# Patient Record
Sex: Female | Born: 1960 | Race: White | Hispanic: No | Smoking: Current every day smoker
Health system: Southern US, Community
[De-identification: ages and names within clinical notes are randomized; demographics above are authoritative.]

## PROBLEM LIST (undated history)

## (undated) DIAGNOSIS — I219 Acute myocardial infarction, unspecified: Secondary | ICD-10-CM

## (undated) DIAGNOSIS — J189 Pneumonia, unspecified organism: Secondary | ICD-10-CM

## (undated) DIAGNOSIS — J449 Chronic obstructive pulmonary disease, unspecified: Secondary | ICD-10-CM

## (undated) DIAGNOSIS — C259 Malignant neoplasm of pancreas, unspecified: Secondary | ICD-10-CM

## (undated) DIAGNOSIS — F32A Depression, unspecified: Secondary | ICD-10-CM

## (undated) DIAGNOSIS — R1900 Intra-abdominal and pelvic swelling, mass and lump, unspecified site: Secondary | ICD-10-CM

## (undated) DIAGNOSIS — F419 Anxiety disorder, unspecified: Secondary | ICD-10-CM

## (undated) DIAGNOSIS — C229 Malignant neoplasm of liver, not specified as primary or secondary: Secondary | ICD-10-CM

## (undated) DIAGNOSIS — E785 Hyperlipidemia, unspecified: Secondary | ICD-10-CM

## (undated) DIAGNOSIS — F329 Major depressive disorder, single episode, unspecified: Secondary | ICD-10-CM

## (undated) DIAGNOSIS — G40909 Epilepsy, unspecified, not intractable, without status epilepticus: Secondary | ICD-10-CM

## (undated) DIAGNOSIS — I10 Essential (primary) hypertension: Secondary | ICD-10-CM

## (undated) HISTORY — PX: TONSILLECTOMY: SUR1361

---

## 2016-09-03 ENCOUNTER — Other Ambulatory Visit: Payer: Self-pay | Admitting: Family Medicine

## 2016-09-03 DIAGNOSIS — Z1231 Encounter for screening mammogram for malignant neoplasm of breast: Secondary | ICD-10-CM

## 2016-09-26 ENCOUNTER — Ambulatory Visit
Admission: RE | Admit: 2016-09-26 | Discharge: 2016-09-26 | Disposition: A | Discharge status: Home / Self Care | Payer: Medicare Other | Source: Ambulatory Visit | Attending: Family Medicine | Admitting: Family Medicine

## 2016-09-26 ENCOUNTER — Encounter: Payer: Self-pay | Admitting: Radiology

## 2016-09-26 DIAGNOSIS — Z1231 Encounter for screening mammogram for malignant neoplasm of breast: Secondary | ICD-10-CM | POA: Insufficient documentation

## 2016-10-17 DIAGNOSIS — Z79899 Other long term (current) drug therapy: Secondary | ICD-10-CM | POA: Diagnosis not present

## 2016-10-17 DIAGNOSIS — R1011 Right upper quadrant pain: Secondary | ICD-10-CM | POA: Diagnosis not present

## 2016-10-17 DIAGNOSIS — R197 Diarrhea, unspecified: Secondary | ICD-10-CM | POA: Insufficient documentation

## 2016-10-17 DIAGNOSIS — R1013 Epigastric pain: Secondary | ICD-10-CM | POA: Diagnosis present

## 2016-10-17 DIAGNOSIS — R1110 Vomiting, unspecified: Secondary | ICD-10-CM | POA: Diagnosis not present

## 2016-10-17 LAB — CBC
HCT: 47.4 % — ABNORMAL HIGH (ref 35.0–47.0)
HEMOGLOBIN: 16.2 g/dL — AB (ref 12.0–16.0)
MCH: 32.6 pg (ref 26.0–34.0)
MCHC: 34.3 g/dL (ref 32.0–36.0)
MCV: 95.3 fL (ref 80.0–100.0)
PLATELETS: 353 10*3/uL (ref 150–440)
RBC: 4.97 MIL/uL (ref 3.80–5.20)
RDW: 12.2 % (ref 11.5–14.5)
WBC: 8.3 10*3/uL (ref 3.6–11.0)

## 2016-10-17 LAB — COMPREHENSIVE METABOLIC PANEL
ALBUMIN: 4.7 g/dL (ref 3.5–5.0)
ALK PHOS: 59 U/L (ref 38–126)
ALT: 31 U/L (ref 14–54)
ANION GAP: 15 (ref 5–15)
AST: 38 U/L (ref 15–41)
BUN: 6 mg/dL (ref 6–20)
CALCIUM: 9.8 mg/dL (ref 8.9–10.3)
CHLORIDE: 97 mmol/L — AB (ref 101–111)
CO2: 19 mmol/L — AB (ref 22–32)
Creatinine, Ser: 0.6 mg/dL (ref 0.44–1.00)
GFR calc Af Amer: >60 mL/min (ref >60)
GFR calc non Af Amer: >60 mL/min (ref >60)
GLUCOSE: 140 mg/dL — AB (ref 65–99)
Potassium: 3.6 mmol/L (ref 3.5–5.1)
SODIUM: 131 mmol/L — AB (ref 135–145)
Total Bilirubin: 1 mg/dL (ref 0.3–1.2)
Total Protein: 8.3 g/dL — ABNORMAL HIGH (ref 6.5–8.1)

## 2016-10-17 LAB — TROPONIN I

## 2016-10-17 LAB — LIPASE, BLOOD: Lipase: 47 U/L (ref 11–51)

## 2016-10-17 MED ORDER — ONDANSETRON 4 MG PO TBDP
4.0000 mg | ORAL_TABLET | Freq: Once | ORAL | Status: AC
  Administered 2016-10-17: 4 mg via ORAL

## 2016-10-17 MED ORDER — ONDANSETRON 4 MG PO TBDP
ORAL_TABLET | ORAL | Status: AC
  Filled 2016-10-17: qty 1

## 2016-10-17 NOTE — ED Triage Notes (Signed)
Pt in with co epigastric pain x 2 days and vomiting today. Has had some diarrhea but no dysuria.

## 2016-10-18 ENCOUNTER — Emergency Department: Payer: Medicare Other

## 2016-10-18 ENCOUNTER — Emergency Department
Admission: EM | Admit: 2016-10-18 | Discharge: 2016-10-18 | Disposition: A | Discharge status: Home / Self Care | Payer: Medicare Other | Attending: Emergency Medicine | Admitting: Emergency Medicine

## 2016-10-18 DIAGNOSIS — R1011 Right upper quadrant pain: Secondary | ICD-10-CM

## 2016-10-18 DIAGNOSIS — R1110 Vomiting, unspecified: Secondary | ICD-10-CM

## 2016-10-18 LAB — URINALYSIS COMPLETE WITH MICROSCOPIC (ARMC ONLY)
Bilirubin Urine: NEGATIVE
Glucose, UA: 150 mg/dL — AB
NITRITE: NEGATIVE
PH: 5 (ref 5.0–8.0)
PROTEIN: 100 mg/dL — AB
SPECIFIC GRAVITY, URINE: 1.018 (ref 1.005–1.030)

## 2016-10-18 MED ORDER — ONDANSETRON HCL 4 MG/2ML IJ SOLN
INTRAMUSCULAR | Status: AC
  Administered 2016-10-18: 4 mg via INTRAVENOUS
  Filled 2016-10-18: qty 2

## 2016-10-18 MED ORDER — ONDANSETRON 4 MG PO TBDP: 4.0000 mg | ORAL_TABLET | Freq: Three times a day (TID) | ORAL | 0 refills | Status: AC | PRN

## 2016-10-18 MED ORDER — ACETAMINOPHEN 325 MG PO TABS
ORAL_TABLET | ORAL | Status: AC
  Administered 2016-10-18: 650 mg via ORAL
  Filled 2016-10-18: qty 2

## 2016-10-18 MED ORDER — ACETAMINOPHEN 325 MG PO TABS
650.0000 mg | ORAL_TABLET | Freq: Once | ORAL | Status: AC
  Administered 2016-10-18: 650 mg via ORAL

## 2016-10-18 MED ORDER — SODIUM CHLORIDE 0.9 % IV BOLUS (SEPSIS)
1000.0000 mL | Freq: Once | INTRAVENOUS | Status: AC
  Administered 2016-10-18: 1000 mL via INTRAVENOUS

## 2016-10-18 MED ORDER — ONDANSETRON HCL 4 MG/2ML IJ SOLN
4.0000 mg | Freq: Once | INTRAMUSCULAR | Status: AC
  Administered 2016-10-18: 4 mg via INTRAVENOUS

## 2016-10-18 NOTE — ED Notes (Signed)
Patient transported to Ultrasound 

## 2016-10-18 NOTE — ED Provider Notes (Signed)
[facility] Emergency Department Provider Note    First MD Initiated Contact with Patient 10/18/16 0116     (approximate)  I have reviewed the triage vital signs and the nursing notes.   HISTORY  Chief Complaint Abdominal Pain    HPI Chelsea Clements is a 55 y.o. female presents with 2 day history of epigastric abdominal pain and vomiting. Patient also admits to 2 episodes of diarrhea. Patient denies any fever. Patient denies any urinary symptoms. Patient states symptoms onset occurred after eating [place] food.   Past medical history None There are no active problems to display for this patient.   Past surgical history None  Prior to Admission medications   Not on File    Allergies Latex  No family history on file.  Social History Social History  Substance Use Topics  . Smoking status: Not on file  . Smokeless tobacco: Not on file  . Alcohol use Not on file    Review of Systems Constitutional: No fever/chills Eyes: No visual changes. ENT: No sore throat. Cardiovascular: Denies chest pain. Respiratory: Denies shortness of breath. Gastrointestinal: Positive for abdominal pain and vomiting and diarrhea Genitourinary: Negative for dysuria. Musculoskeletal: Negative for back pain. Skin: Negative for rash. Neurological: Negative for headaches, focal weakness or numbness.  10-point ROS otherwise negative.  ____________________________________________   PHYSICAL EXAM:  VITAL SIGNS: ED Triage Vitals  Enc Vitals Group     BP 10/17/16 2112 (!) 155/90     Pulse Rate 10/17/16 2112 86     Resp 10/17/16 2112 18     Temp 10/17/16 2112 98.4 F (36.9 C)     Temp Source 10/17/16 2112 Oral     SpO2 10/17/16 2112 100 %     Weight 10/17/16 2113 145 lb (65.8 kg)     Height 10/17/16 2113 5\' 4"  (1.626 m)     Head Circumference --      Peak Flow --      Pain Score 10/17/16 2113 6     Pain Loc --      Pain Edu? --      Excl. in [place]? --       Constitutional: Alert and oriented. Well appearing and in no acute distress. Eyes: Conjunctivae are normal. PERRL. EOMI. Head: Atraumatic. Ears:  Healthy appearing ear canals and TMs bilaterally Nose: No congestion/rhinnorhea. Mouth/Throat: Mucous membranes are moist.  Oropharynx non-erythematous. Neck: No stridor.  No meningeal signs.  No cervical spine tenderness to palpation. Cardiovascular: Normal rate, regular rhythm. Good peripheral circulation. Grossly normal heart sounds. Respiratory: Normal respiratory effort.  No retractions. Lungs CTAB. Gastrointestinal: Soft and nontender. No distention.  Musculoskeletal: No lower extremity tenderness nor edema. No gross deformities of extremities. Neurologic:  Normal speech and language. No gross focal neurologic deficits are appreciated.  Skin:  Skin is warm, dry and intact. No rash noted. Psychiatric: Mood and affect are normal. Speech and behavior are normal.  ____________________________________________   LABS (all labs ordered are listed, but only abnormal results are displayed)  Labs Reviewed  CBC - Abnormal; Notable for the following:       Result Value   Hemoglobin 16.2 (*)    HCT 47.4 (*)    All other components within normal limits  COMPREHENSIVE METABOLIC PANEL - Abnormal; Notable for the following:    Sodium 131 (*)    Chloride 97 (*)    CO2 19 (*)    Glucose, Bld 140 (*)    Total Protein 8.3 (*)  All other components within normal limits  URINALYSIS COMPLETEWITH MICROSCOPIC ([facility] ONLY) - Abnormal; Notable for the following:    Color, Urine YELLOW (*)    APPearance HAZY (*)    Glucose, UA 150 (*)    Ketones, ur 1+ (*)    Hgb urine dipstick 1+ (*)    Protein, ur 100 (*)    Leukocytes, UA 1+ (*)    Bacteria, UA RARE (*)    Squamous Epithelial / LPF 6-30 (*)    All other components within normal limits  LIPASE, BLOOD  TROPONIN I   ____________________________________________  EKG  ED ECG REPORT I,  Cutler N BROWN, the attending physician, personally viewed and interpreted this ECG.   Date: 10/18/2016  EKG Time: 9:21 PM  Rate: 116  Rhythm: Sinus tachycardia  Axis: Normal  Intervals: Normal  ST&T Change: None  ____________________________________________  RADIOLOGY I, Santaquin N BROWN, personally viewed and evaluated these images (plain radiographs) as part of my medical decision making, as well as reviewing the written report by the radiologist.  US Abdomen Limited Ruq  Result Date: 10/18/2016 CLINICAL DATA:  Right upper quadrant pain and vomiting for 2 days EXAM: US ABDOMEN LIMITED - RIGHT UPPER QUADRANT COMPARISON:  None. FINDINGS: Gallbladder: No gallstones or wall thickening visualized. No sonographic Murphy sign noted by sonographer. Common bile duct: Diameter: 4.5 mm Liver: Mildly increased echogenicity and mild coarsening of hepatic parenchyma, likely fatty infiltration. No focal liver lesion. IMPRESSION: Hepatic parenchymal echogenicity, suggesting fatty infiltration. Normal gallbladder and bile ducts. Electronically Signed   By: Andreas Newport M.D.   On: 10/18/2016 02:12     Procedures     INITIAL IMPRESSION / ASSESSMENT AND PLAN / ED COURSE  Pertinent labs & imaging results that were available during my care of the patient were reviewed by me and considered in my medical decision making (see chart for details).  History of physical exam consistent with possible gastroenteritis. Laboratory data unremarkable ultrasound also unremarkable. Patient received IV normal saline and Zofran in the emergency department with complete resolution of symptoms.   Clinical Course    ____________________________________________  FINAL CLINICAL IMPRESSION(S) / ED DIAGNOSES  Final diagnoses:  Vomiting  RUQ pain     MEDICATIONS GIVEN DURING THIS VISIT:  Medications  ondansetron (ZOFRAN-ODT) 4 MG disintegrating tablet (not administered)  ondansetron (ZOFRAN-ODT)  disintegrating tablet 4 mg (4 mg Oral Given 10/17/16 2117)  ondansetron (ZOFRAN) injection 4 mg (4 mg Intravenous Given 10/18/16 0132)  sodium chloride 0.9 % bolus 1,000 mL (0 mLs Intravenous Stopped 10/18/16 0329)  acetaminophen (TYLENOL) tablet 650 mg (650 mg Oral Given 10/18/16 0328)     NEW OUTPATIENT MEDICATIONS STARTED DURING THIS VISIT:  New Prescriptions   No medications on file    Modified Medications   No medications on file    Discontinued Medications   No medications on file     Note:  This document was prepared using Dragon voice recognition software and may include unintentional dictation errors.    Gregor Hams, MD 10/18/16 2249

## 2016-10-18 NOTE — ED Notes (Signed)
ED Provider at bedside. 

## 2016-10-18 NOTE — ED Notes (Signed)

## 2016-11-29 ENCOUNTER — Encounter: Payer: Self-pay | Admitting: Emergency Medicine

## 2016-11-29 ENCOUNTER — Emergency Department
Admission: EM | Admit: 2016-11-29 | Discharge: 2016-11-29 | Disposition: A | Discharge status: Home / Self Care | Payer: Medicare Other | Attending: Emergency Medicine | Admitting: Emergency Medicine

## 2016-11-29 ENCOUNTER — Emergency Department: Payer: Medicare Other

## 2016-11-29 DIAGNOSIS — Z79899 Other long term (current) drug therapy: Secondary | ICD-10-CM | POA: Insufficient documentation

## 2016-11-29 DIAGNOSIS — I10 Essential (primary) hypertension: Secondary | ICD-10-CM | POA: Insufficient documentation

## 2016-11-29 DIAGNOSIS — Y999 Unspecified external cause status: Secondary | ICD-10-CM | POA: Diagnosis not present

## 2016-11-29 DIAGNOSIS — Y939 Activity, unspecified: Secondary | ICD-10-CM | POA: Insufficient documentation

## 2016-11-29 DIAGNOSIS — W01198A Fall on same level from slipping, tripping and stumbling with subsequent striking against other object, initial encounter: Secondary | ICD-10-CM | POA: Insufficient documentation

## 2016-11-29 DIAGNOSIS — Y929 Unspecified place or not applicable: Secondary | ICD-10-CM | POA: Insufficient documentation

## 2016-11-29 DIAGNOSIS — S0081XA Abrasion of other part of head, initial encounter: Secondary | ICD-10-CM | POA: Diagnosis not present

## 2016-11-29 DIAGNOSIS — F17200 Nicotine dependence, unspecified, uncomplicated: Secondary | ICD-10-CM | POA: Diagnosis not present

## 2016-11-29 DIAGNOSIS — S93492A Sprain of other ligament of left ankle, initial encounter: Secondary | ICD-10-CM | POA: Diagnosis not present

## 2016-11-29 DIAGNOSIS — S99912A Unspecified injury of left ankle, initial encounter: Secondary | ICD-10-CM | POA: Diagnosis present

## 2016-11-29 HISTORY — DX: Epilepsy, unspecified, not intractable, without status epilepticus: G40.909

## 2016-11-29 HISTORY — DX: Essential (primary) hypertension: I10

## 2016-11-29 MED ORDER — MELOXICAM 7.5 MG PO TABS: 7.5000 mg | ORAL_TABLET | Freq: Every day | ORAL | 2 refills | Status: DC

## 2016-11-29 MED ORDER — KETOROLAC TROMETHAMINE 60 MG/2ML IM SOLN
60.0000 mg | Freq: Once | INTRAMUSCULAR | Status: AC
  Administered 2016-11-29: 60 mg via INTRAMUSCULAR
  Filled 2016-11-29: qty 2

## 2016-11-29 NOTE — ED Provider Notes (Signed)
[facility] Emergency Department Provider Note  ____________________________________________   None    (approximate)  I have reviewed the triage vital signs and the nursing notes.   HISTORY  Chief Complaint Head Laceration and Ankle Pain    HPI Fantashia Jochim is a 55 y.o. female presenting after slipping and falling while carrying food. Patient states that she did not lose consciousness but hit the back of her head during the fall. She denies nausea, vomiting and acute blurry vision. Additional complaints include left ankle pain. Patient states that when she fell, she sustained a left ankle inversion injury. Patient has never had an ankle sprain to the left lower extremity. She denies prior traumas or surgeries to the left lower extremity. Pain is currently 6 out of 10 in intensity and described as throbbing. She has taken Tylenol, which has provided limited relief.   Past Medical History:  Diagnosis Date  . Epilepsy ([place])   . Hypertension     There are no active problems to display for this patient.   History reviewed. No pertinent surgical history.  Prior to Admission medications   Medication Sig Start Date End Date Taking? Authorizing Provider  ADVAIR DISKUS 250-50 MCG/DOSE AEPB Take 1 puff by mouth daily.    Historical Provider, MD  amLODipine (NORVASC) 5 MG tablet Take 5 mg by mouth daily.    Historical Provider, MD  escitalopram (LEXAPRO) 20 MG tablet Take 20 mg by mouth daily.    Historical Provider, MD  fenofibrate (TRICOR) 145 MG tablet Take 145 mg by mouth daily.    Historical Provider, MD  lamoTRIgine (LAMICTAL) 100 MG tablet Take 100 mg by mouth 2 (two) times daily.    Historical Provider, MD  lamoTRIgine (LAMICTAL) 25 MG tablet Take 50 mg by mouth every morning.    Historical Provider, MD  meloxicam (MOBIC) 7.5 MG tablet Take 1 tablet (7.5 mg total) by mouth daily. 11/29/16 11/29/17  Lannie Fields, PA-C  ondansetron (ZOFRAN ODT) 4 MG  disintegrating tablet Take 1 tablet (4 mg total) by mouth every 8 (eight) hours as needed for nausea or vomiting. 10/18/16   Gregor Hams, MD  simvastatin (ZOCOR) 20 MG tablet Take 20 mg by mouth at bedtime.    Historical Provider, MD    Allergies Latex  No family history on file.  Social History Social History  Substance Use Topics  . Smoking status: Current Every Day Smoker  . Smokeless tobacco: Never Used  . Alcohol use No    Review of Systems Constitutional: No fever/chills Eyes: No visual changes. Cardiovascular: Denies chest pain. Respiratory: Denies shortness of breath. Gastrointestinal: No abdominal pain.  No nausea, no vomiting.  No diarrhea.  No constipation. Musculoskeletal: Patient has left ankle pain. Skin: Negative for rash. Neurological: Negative for headaches, focal weakness or numbness.  10-point ROS otherwise negative.  ____________________________________________   PHYSICAL EXAM:  VITAL SIGNS: ED Triage Vitals  Enc Vitals Group     BP 11/29/16 1602 136/72     Pulse Rate 11/29/16 1602 92     Resp 11/29/16 1602 16     Temp 11/29/16 1602 99.4 F (37.4 C)     Temp Source 11/29/16 1602 Oral     SpO2 11/29/16 1602 99 %     Weight 11/29/16 1604 140 lb (63.5 kg)     Height 11/29/16 1604 5\' 3"  (1.6 m)     Head Circumference --      Peak Flow --  Pain Score 11/29/16 1604 6     Pain Loc --      Pain Edu? --      Excl. in [place]? --     Constitutional: Alert and oriented. Well appearing and in no acute distress. Eyes: Conjunctivae are normal. PERRL. EOMI. Head: Patient has a 1 cm x 1 cm abrasion on the back of her head.  Cardiovascular: Normal rate, regular rhythm. Grossly normal heart sounds.  Good peripheral circulation. Respiratory: Normal respiratory effort.  No retractions. Lungs CTAB. Gastrointestinal: Soft and nontender. No distention. No abdominal bruits. No CVA tenderness. Musculoskeletal: Patient has 5/5 strength in the upper and lower  extremities bilaterally.  Full range of motion at the hip and knee bilaterally. Patient has limited dorsiflexion and plantar flexion on the left, likely secondary to pain. Patient is tender to palpation along the anterior talofibular ligament, left. Palpable dorsalis pedis pulse bilaterally. Neurologic:  Normal speech and language. No gross focal neurologic deficits are appreciated. No gait instability. Skin:  Skin is warm, dry and intact. Bruising is visualized at the skin overlying the anterior talofibular ligament. Psychiatric: Mood and affect are normal. Speech and behavior are normal.  ____________________________________________   LABS (all labs ordered are listed, but only abnormal results are displayed)  Labs Reviewed - No data to display  RADIOLOGY  DG Ankle: No acute fractures or dislocations    Procedures  Toradol    ____________________________________________   INITIAL IMPRESSION / ASSESSMENT AND PLAN / ED COURSE  Pertinent labs & imaging results that were available during my care of the patient were reviewed by me and considered in my medical decision making (see chart for details).  Assessment and plan: Left ankle sprain: DG ankle conducted in the emergency department does not reveal fractures or dislocations. An ace wrap was applied to left ankle and rest, ice and elevation were encouraged. Patient was prescribed Mobic to be used as needed for pain and inflammation. A referral was made to the orthopedist on-call, Dr. Marry Guan. Patient was advised to make an appointment in one week if left ankle pain persists. All patient questions were answered.  Clinical Course      ____________________________________________   FINAL CLINICAL IMPRESSION(S) / ED DIAGNOSES  Final diagnoses:  Sprain of anterior talofibular ligament of left ankle, initial encounter      NEW MEDICATIONS STARTED DURING THIS VISIT:  New Prescriptions   MELOXICAM (MOBIC) 7.5 MG TABLET     Take 1 tablet (7.5 mg total) by mouth daily.     Note:  This document was prepared using Dragon voice recognition software and may include unintentional dictation errors.    Lannie Fields, PA-C 11/29/16 1727    Hinda Kehr, MD 11/29/16 2027

## 2016-11-29 NOTE — ED Notes (Signed)
Pt given crutches. Pt taught how to use crutches, pt demonstrated.

## 2016-11-29 NOTE — ED Notes (Signed)
Applied ace wrap to left ankle.

## 2016-11-29 NOTE — ED Notes (Signed)
Ice applied to left ankle.

## 2016-11-29 NOTE — ED Triage Notes (Signed)
Pt comes into the ED via POV c/o head laceration and left foot pain after a fall.  Patient had mechanical fall, denies LOC, N/V.  Patient A&Ox4 and in NAD at this time.

## 2017-11-28 ENCOUNTER — Observation Stay: Payer: Self-pay

## 2017-11-28 ENCOUNTER — Emergency Department (HOSPITAL_COMMUNITY): Payer: Medicare Other

## 2017-11-28 ENCOUNTER — Encounter (HOSPITAL_COMMUNITY): Payer: Self-pay | Admitting: Emergency Medicine

## 2017-11-28 ENCOUNTER — Inpatient Hospital Stay (HOSPITAL_COMMUNITY)
Admission: EM | Admit: 2017-11-28 | Discharge: 2017-12-03 | DRG: 436 | Disposition: A | Discharge status: Home / Self Care | Payer: Medicare Other | Attending: Internal Medicine | Admitting: Internal Medicine

## 2017-11-28 ENCOUNTER — Other Ambulatory Visit: Payer: Self-pay

## 2017-11-28 DIAGNOSIS — K449 Diaphragmatic hernia without obstruction or gangrene: Secondary | ICD-10-CM | POA: Diagnosis present

## 2017-11-28 DIAGNOSIS — R630 Anorexia: Secondary | ICD-10-CM | POA: Diagnosis not present

## 2017-11-28 DIAGNOSIS — Z8249 Family history of ischemic heart disease and other diseases of the circulatory system: Secondary | ICD-10-CM

## 2017-11-28 DIAGNOSIS — F1010 Alcohol abuse, uncomplicated: Secondary | ICD-10-CM | POA: Diagnosis present

## 2017-11-28 DIAGNOSIS — M549 Dorsalgia, unspecified: Secondary | ICD-10-CM | POA: Diagnosis not present

## 2017-11-28 DIAGNOSIS — I252 Old myocardial infarction: Secondary | ICD-10-CM

## 2017-11-28 DIAGNOSIS — F17210 Nicotine dependence, cigarettes, uncomplicated: Secondary | ICD-10-CM | POA: Diagnosis present

## 2017-11-28 DIAGNOSIS — R1010 Upper abdominal pain, unspecified: Secondary | ICD-10-CM | POA: Diagnosis not present

## 2017-11-28 DIAGNOSIS — K8689 Other specified diseases of pancreas: Secondary | ICD-10-CM | POA: Diagnosis present

## 2017-11-28 DIAGNOSIS — I10 Essential (primary) hypertension: Secondary | ICD-10-CM | POA: Diagnosis not present

## 2017-11-28 DIAGNOSIS — K869 Disease of pancreas, unspecified: Secondary | ICD-10-CM | POA: Diagnosis not present

## 2017-11-28 DIAGNOSIS — R634 Abnormal weight loss: Secondary | ICD-10-CM | POA: Diagnosis present

## 2017-11-28 DIAGNOSIS — C787 Secondary malignant neoplasm of liver and intrahepatic bile duct: Secondary | ICD-10-CM | POA: Diagnosis not present

## 2017-11-28 DIAGNOSIS — Z6824 Body mass index (BMI) 24.0-24.9, adult: Secondary | ICD-10-CM

## 2017-11-28 DIAGNOSIS — K769 Liver disease, unspecified: Secondary | ICD-10-CM | POA: Diagnosis not present

## 2017-11-28 DIAGNOSIS — Z79899 Other long term (current) drug therapy: Secondary | ICD-10-CM

## 2017-11-28 DIAGNOSIS — R1013 Epigastric pain: Secondary | ICD-10-CM | POA: Diagnosis not present

## 2017-11-28 DIAGNOSIS — F329 Major depressive disorder, single episode, unspecified: Secondary | ICD-10-CM | POA: Diagnosis present

## 2017-11-28 DIAGNOSIS — E785 Hyperlipidemia, unspecified: Secondary | ICD-10-CM | POA: Diagnosis present

## 2017-11-28 DIAGNOSIS — R160 Hepatomegaly, not elsewhere classified: Secondary | ICD-10-CM

## 2017-11-28 DIAGNOSIS — G40409 Other generalized epilepsy and epileptic syndromes, not intractable, without status epilepticus: Secondary | ICD-10-CM | POA: Diagnosis present

## 2017-11-28 DIAGNOSIS — R1084 Generalized abdominal pain: Secondary | ICD-10-CM

## 2017-11-28 DIAGNOSIS — C250 Malignant neoplasm of head of pancreas: Principal | ICD-10-CM | POA: Diagnosis present

## 2017-11-28 DIAGNOSIS — G893 Neoplasm related pain (acute) (chronic): Secondary | ICD-10-CM | POA: Diagnosis present

## 2017-11-28 DIAGNOSIS — J449 Chronic obstructive pulmonary disease, unspecified: Secondary | ICD-10-CM | POA: Diagnosis present

## 2017-11-28 DIAGNOSIS — F410 Panic disorder [episodic paroxysmal anxiety] without agoraphobia: Secondary | ICD-10-CM | POA: Diagnosis present

## 2017-11-28 DIAGNOSIS — Z91040 Latex allergy status: Secondary | ICD-10-CM

## 2017-11-28 DIAGNOSIS — Z801 Family history of malignant neoplasm of trachea, bronchus and lung: Secondary | ICD-10-CM

## 2017-11-28 DIAGNOSIS — K921 Melena: Secondary | ICD-10-CM | POA: Diagnosis present

## 2017-11-28 DIAGNOSIS — G40909 Epilepsy, unspecified, not intractable, without status epilepticus: Secondary | ICD-10-CM | POA: Diagnosis not present

## 2017-11-28 DIAGNOSIS — R109 Unspecified abdominal pain: Secondary | ICD-10-CM | POA: Diagnosis present

## 2017-11-28 DIAGNOSIS — F411 Generalized anxiety disorder: Secondary | ICD-10-CM | POA: Diagnosis present

## 2017-11-28 DIAGNOSIS — R1900 Intra-abdominal and pelvic swelling, mass and lump, unspecified site: Secondary | ICD-10-CM | POA: Diagnosis present

## 2017-11-28 DIAGNOSIS — Z8701 Personal history of pneumonia (recurrent): Secondary | ICD-10-CM

## 2017-11-28 DIAGNOSIS — Z66 Do not resuscitate: Secondary | ICD-10-CM | POA: Diagnosis present

## 2017-11-28 DIAGNOSIS — C259 Malignant neoplasm of pancreas, unspecified: Secondary | ICD-10-CM

## 2017-11-28 DIAGNOSIS — K649 Unspecified hemorrhoids: Secondary | ICD-10-CM | POA: Diagnosis present

## 2017-11-28 DIAGNOSIS — E440 Moderate protein-calorie malnutrition: Secondary | ICD-10-CM | POA: Diagnosis present

## 2017-11-28 HISTORY — DX: Intra-abdominal and pelvic swelling, mass and lump, unspecified site: R19.00

## 2017-11-28 HISTORY — DX: Depression, unspecified: F32.A

## 2017-11-28 HISTORY — DX: Hyperlipidemia, unspecified: E78.5

## 2017-11-28 HISTORY — DX: Acute myocardial infarction, unspecified: I21.9

## 2017-11-28 HISTORY — DX: Major depressive disorder, single episode, unspecified: F32.9

## 2017-11-28 HISTORY — DX: Malignant neoplasm of pancreas, unspecified: C25.9

## 2017-11-28 HISTORY — DX: Malignant neoplasm of liver, not specified as primary or secondary: C22.9

## 2017-11-28 HISTORY — DX: Anxiety disorder, unspecified: F41.9

## 2017-11-28 HISTORY — DX: Chronic obstructive pulmonary disease, unspecified: J44.9

## 2017-11-28 HISTORY — DX: Pneumonia, unspecified organism: J18.9

## 2017-11-28 LAB — I-STAT TROPONIN, ED: Troponin i, poc: 0 ng/mL (ref 0.00–0.08)

## 2017-11-28 LAB — AMYLASE: Amylase: 241 U/L — ABNORMAL HIGH (ref 28–100)

## 2017-11-28 LAB — APTT: APTT: 28 s (ref 24–36)

## 2017-11-28 LAB — BASIC METABOLIC PANEL
ANION GAP: 13 (ref 5–15)
BUN: 5 mg/dL — ABNORMAL LOW (ref 6–20)
CHLORIDE: 99 mmol/L — AB (ref 101–111)
CO2: 23 mmol/L (ref 22–32)
CREATININE: 0.61 mg/dL (ref 0.44–1.00)
Calcium: 9.3 mg/dL (ref 8.9–10.3)
GFR calc non Af Amer: >60 mL/min (ref >60)
Glucose, Bld: 98 mg/dL (ref 65–99)
POTASSIUM: 3.4 mmol/L — AB (ref 3.5–5.1)
SODIUM: 135 mmol/L (ref 135–145)

## 2017-11-28 LAB — PROTIME-INR
INR: 1.08
PROTHROMBIN TIME: 13.9 s (ref 11.4–15.2)

## 2017-11-28 LAB — CBC
HCT: 42.2 % (ref 36.0–46.0)
Hemoglobin: 14.2 g/dL (ref 12.0–15.0)
MCH: 32.3 pg (ref 26.0–34.0)
MCHC: 33.6 g/dL (ref 30.0–36.0)
MCV: 96.1 fL (ref 78.0–100.0)
PLATELETS: 310 10*3/uL (ref 150–400)
RBC: 4.39 MIL/uL (ref 3.87–5.11)
RDW: 11.9 % (ref 11.5–15.5)
WBC: 8.9 10*3/uL (ref 4.0–10.5)

## 2017-11-28 LAB — HEPATIC FUNCTION PANEL
ALBUMIN: 3.5 g/dL (ref 3.5–5.0)
ALT: 29 U/L (ref 14–54)
AST: 40 U/L (ref 15–41)
Alkaline Phosphatase: 100 U/L (ref 38–126)
Bilirubin, Direct: 0.1 mg/dL (ref 0.1–0.5)
Indirect Bilirubin: 0.6 mg/dL (ref 0.3–0.9)
Total Bilirubin: 0.7 mg/dL (ref 0.3–1.2)
Total Protein: 6.9 g/dL (ref 6.5–8.1)

## 2017-11-28 LAB — LACTATE DEHYDROGENASE: LDH: 88 U/L — AB (ref 98–192)

## 2017-11-28 LAB — LIPASE, BLOOD: LIPASE: 362 U/L — AB (ref 11–51)

## 2017-11-28 LAB — FERRITIN: Ferritin: 348 ng/mL — ABNORMAL HIGH (ref 11–307)

## 2017-11-28 MED ORDER — MORPHINE SULFATE (PF) 4 MG/ML IV SOLN
2.0000 mg | INTRAVENOUS | Status: DC | PRN
  Administered 2017-11-28 – 2017-11-29 (×2): 2 mg via INTRAVENOUS
  Filled 2017-11-28 (×3): qty 1

## 2017-11-28 MED ORDER — LAMOTRIGINE 100 MG PO TABS
100.0000 mg | ORAL_TABLET | Freq: Two times a day (BID) | ORAL | Status: DC
  Administered 2017-11-28 – 2017-12-03 (×10): 100 mg via ORAL
  Filled 2017-11-28 (×10): qty 1

## 2017-11-28 MED ORDER — ACETAMINOPHEN 650 MG RE SUPP: 650.0000 mg | Freq: Four times a day (QID) | RECTAL | Status: DC | PRN

## 2017-11-28 MED ORDER — ACETAMINOPHEN 325 MG PO TABS
650.0000 mg | ORAL_TABLET | Freq: Four times a day (QID) | ORAL | Status: DC | PRN
  Administered 2017-12-01 – 2017-12-02 (×3): 650 mg via ORAL
  Filled 2017-11-28 (×3): qty 2

## 2017-11-28 MED ORDER — PROMETHAZINE HCL 25 MG PO TABS
12.5000 mg | ORAL_TABLET | Freq: Four times a day (QID) | ORAL | Status: DC | PRN
  Administered 2017-11-28: 12.5 mg via ORAL
  Filled 2017-11-28: qty 1

## 2017-11-28 MED ORDER — ENOXAPARIN SODIUM 40 MG/0.4ML ~~LOC~~ SOLN
40.0000 mg | SUBCUTANEOUS | Status: DC
  Filled 2017-11-28: qty 0.4

## 2017-11-28 MED ORDER — MORPHINE SULFATE (PF) 4 MG/ML IV SOLN
4.0000 mg | Freq: Once | INTRAVENOUS | Status: AC
  Administered 2017-11-28: 4 mg via INTRAVENOUS
  Filled 2017-11-28: qty 1

## 2017-11-28 MED ORDER — SODIUM CHLORIDE 0.9% FLUSH
3.0000 mL | Freq: Two times a day (BID) | INTRAVENOUS | Status: DC
  Administered 2017-11-28 – 2017-12-03 (×8): 3 mL via INTRAVENOUS

## 2017-11-28 MED ORDER — NICOTINE 21 MG/24HR TD PT24
21.0000 mg | MEDICATED_PATCH | Freq: Once | TRANSDERMAL | Status: AC
  Administered 2017-11-28: 21 mg via TRANSDERMAL
  Filled 2017-11-28: qty 1

## 2017-11-28 NOTE — ED Triage Notes (Signed)
Pt states had CT scan done and full work up at PCP for centralized abdominal pain, that has radiated to her back and into her chest, states she was diagnosed with masses in her pancrease and liver and was told to come to ER for admission and oncology consult. 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

## 2017-11-28 NOTE — Consult Note (Signed)
Marland Kitchen    HEMATOLOGY/ONCOLOGY CONSULTATION NOTE  Date of Service: 11/28/2017  Patient Care Team: Remi Haggard, FNP as PCP - General (Family Medicine)  CHIEF COMPLAINTS/PURPOSE OF CONSULTATION:  Concern for metastatic pancreatic cancer  HISTORY OF PRESENTING ILLNESS:   Chelsea Clements is a wonderful 56 y.o. female who has been referred to [place] by Dr Heber [place], Rachel Moulds, DO for evaluation and management of new findings concerning for metastatic pancreatic cancer.  Patient has a history of hypertension, depression, dyslipidemia, COPD, epilepsy and presented to her PCP on Wednesday, 11/26/2017 with generalized diffuse abdominal pain and reportedly had a CT scan of her abdomen yesterday which showed multiple ill-defined lesions in the liver as well as a mass involving the head of the pancreas.  She was sent to the emergency room today by her primary care physician.  In the emergency room she noted that her stools appear dark but she is uncertain if there was blood in it. Denies being on iron or using peptobismol or other meds that could change the color or her stools. Notes that they are green today.  She has been having decreased appetite and difficulty with eating due to pain triggered by oral intake. She notes an 18 pound weight loss from about a year ago when she is to weigh 154 pounds and is now down to 138 pounds.   MEDICAL HISTORY:  Past Medical History:  Diagnosis Date  . Abdominal mass    CT scan of abdomen 11/27/2017, and her results came back CT showing multiple ill defined lesions in the liver, as well as a mass involving the head of the pancreas. hx/notes 11/28/2017  . Anxiety   . COPD (chronic obstructive pulmonary disease) ([place])   . Depression   . Epilepsy ([place])    febrile seizures when she was young, and this changed to grand mal seizures until 2004, and then they switched to focal epilepsy, but it has been well controlled on the current AED/notes 11/28/2017  . HLD  (hyperlipidemia)   . Hypertension   . Liver cancer ([place]) dx'd 11/28/2017  . Myocardial infarction [facility])    [name] 2017; "AMI; age undetermined" (11/28/2017)  . Pancreas cancer ([place]) dx'd 11/28/2017  . Pneumonia ~ 2012X 1    SURGICAL HISTORY: Past Surgical History:  Procedure Laterality Date  . TONSILLECTOMY      SOCIAL HISTORY: Social History   Socioeconomic History  . Marital status: Divorced    Spouse name: Not on file  . Number of children: Not on file  . Years of education: Not on file  . Highest education level: Not on file  Social Needs  . Financial resource strain: Not on file  . Food insecurity - worry: Not on file  . Food insecurity - inability: Not on file  . Transportation needs - medical: Not on file  . Transportation needs - non-medical: Not on file  Occupational History  . Not on file  Tobacco Use  . Smoking status: Current Every Day Smoker    Packs/day: 1.00    Years: 40.00    Pack years: 40.00    Types: Cigarettes  . Smokeless tobacco: Never Used  Substance and Sexual Activity  . Alcohol use: Yes    Alcohol/week: 6.0 oz    Types: 10 Cans of beer per week  . Drug use: No  . Sexual activity: No  Other Topics Concern  . Not on file  Social History Narrative  . Not on file  She  Lives  with an autistic child, and she is stressed about that. She lives with an ex-husband of 17 years. Has 3 sisters with great support system. Most of her kids live in [place].  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 She denies heavy alcohol use- occasional beer a day, She denies illicit drug use.  However, ER note states that PCP reported patient has alcohol abuse disorder.    FAMILY HISTORY: Father had small cell lung cancer at age 58 years  mother died from a ventricular tachycardia She notes that heart disease runs  in the family  ALLERGIES:  is allergic to latex.  MEDICATIONS:  Current Facility-Administered Medications  Medication Dose Route Frequency Provider Last Rate Last Dose  . acetaminophen (TYLENOL) tablet 650 mg  650 mg Oral Q6H PRN Burgess Estelle, MD       Or  . acetaminophen (TYLENOL) suppository 650 mg  650 mg Rectal Q6H PRN Burgess Estelle, MD      . enoxaparin (LOVENOX) injection 40 mg  40 mg Subcutaneous Q24H Burgess Estelle, MD      . lamoTRIgine (LAMICTAL) tablet 100 mg  100 mg Oral BID Burgess Estelle, MD   100 mg at 11/28/17 1705  . morphine 4 MG/ML injection 2 mg  2 mg Intravenous Q4H PRN Burgess Estelle, MD      . nicotine (NICODERM CQ - dosed in mg/24 hours) patch 21 mg  21 mg Transdermal Once Ina Homes, MD   21 mg at 11/28/17 1259  . promethazine (PHENERGAN) tablet 12.5 mg  12.5 mg Oral Q6H PRN Burgess Estelle, MD      . sodium chloride flush (NS) 0.9 % injection 3 mL  3 mL Intravenous Q12H Burgess Estelle, MD   3 mL at 11/28/17 1543    REVIEW OF SYSTEMS:    10 Point review of Systems was done is negative except as noted above.  PHYSICAL EXAMINATION: ECOG PERFORMANCE STATUS: 1 - Symptomatic but completely ambulatory  . Vitals:   11/28/17 1345 11/28/17 1457  BP: 127/82 137/80  Pulse: 94 94  Resp: (!) 21 20  Temp:  98.8 F (37.1 C)  SpO2: 99% 96%   Filed Weights   11/28/17 1124  Weight: 138 lb (62.6 kg)   .Body mass index is 24.06 kg/m.  GENERAL:alert, in no acute distress and comfortable SKIN: no acute rashes, no significant lesions EYES: conjunctiva are pink and non-injected, sclera anicteric OROPHARYNX: MMM, no exudates, no oropharyngeal erythema or ulceration NECK: supple, no JVD LYMPH:  no palpable lymphadenopathy in the cervical, axillary or inguinal regions LUNGS: clear to auscultation b/l with normal respiratory effort HEART: regular rate & rhythm ABDOMEN:  normoactive bowel sounds , TTP non focal but more notable around umbilicus. Extremity: no  pedal edema PSYCH: alert & oriented x 3 with fluent speech NEURO: no focal motor/sensory deficits  LABORATORY DATA:  I have reviewed the data as listed  . CBC Latest Ref Rng & Units 11/28/2017 10/17/2016  WBC 4.0 - 10.5 K/uL 8.9 8.3  Hemoglobin 12.0 - 15.0 g/dL 14.2 16.2(H)  Hematocrit 36.0 - 46.0 % 42.2 47.4(H)  Platelets 150 - 400 K/uL 310 353    . CMP Latest Ref Rng & Units 11/28/2017 10/17/2016  Glucose 65 - 99 mg/dL 98 140(H)  BUN 6 - 20 mg/dL 5(L) 6  Creatinine  0.44 - 1.00 mg/dL 0.61 0.60  Sodium 135 - 145 mmol/L 135 131(L)  Potassium 3.5 - 5.1 mmol/L 3.4(L) 3.6  Chloride 101 - 111 mmol/L 99(L) 97(L)  CO2 22 - 32 mmol/L 23 19(L)  Calcium 8.9 - 10.3 mg/dL 9.3 9.8  Total Protein 6.5 - 8.1 g/dL 6.9 8.3(H)  Total Bilirubin 0.3 - 1.2 mg/dL 0.7 1.0  Alkaline Phos 38 - 126 U/L 100 59  AST 15 - 41 U/L 40 38  ALT 14 - 54 U/L 29 31     RADIOGRAPHIC STUDIES: I have personally reviewed the radiological images as listed and agreed with the findings in the report. Dg Chest 2 View  Result Date: 11/28/2017 CLINICAL DATA:  Chest pain. EXAM: CHEST  2 VIEW COMPARISON:  No recent prior. FINDINGS: Mediastinum hilar structures are normal. Right cardiophrenic angle density is noted. This may represent a process such as a pericardial cyst. Mild left base subsegmental atelectasis. No pleural effusion pneumothorax. Contrast in the colon. IMPRESSION: 1. Right cardiophrenic angle density is noted. This may represent a a process such as a pericardial cyst. 2. Mild left base subsegmental atelectasis. Electronically Signed   By: Marcello Moores  Register   On: 11/28/2017 11:02    ASSESSMENT & PLAN:   56 year old female with   1) Abdominal pain from pancreatic mass and concern for multiple liver metastases Overall presentation is concerning for metastatic pancreatic cancer. Per ED report outside CT scan images and report of which are not currently available showed   multiple ill defined lesions in the  liver (largest being 2.4 cm) and a mass involving the head of the pancreas measuring 4.8 x 4.3 cm.  Cannot r/o pancreatitis related to pancreatic duct obstruction   2) Dark stools - ?melena. hgb WNL at this time.  3) Hypokalemia K 3.4 -- replacement per hospitalist PLAN -Case was discussed in detail with ED physician. -I discussed the clinical concerns based on available information with the patient. -Would obtain the CT abdomen images and radiology report from her outside clinic. -If images and detail report not available would repeat CT abdomen pelvis as well as get a CT of the chest to complete staging workup and to direct appropriate area for biopsy. -Would consult interventional radiology for consideration of ultrasound-guided biopsy of liver mass for tissue diagnosis. -Pain management as per hospitalist. -We will send out a CA-[id number], CEA, LDH tumor markers. -Fecal occult blood testing.  If concerns for melena patient might need a GI consultation for endoscopy. -Would recommend checking lipase and amylase levels to rule out pancreatitis related to the pancreatic head mass which could cause duct obstruction. -If ultrasound guided liver mass biopsy not possible patient might need EUS and biopsy of the pancreatic mass. -Once the patient's symptoms are controlled, GI bleeding is ruled out/managed and tissue biopsy has been obtained the patient could potentially be discharged with a plan to follow-up in the outpatient oncology clinic with Dr. Irene Limbo to f/u on the biopsy results and determine further treatment options.  All of the patients questions were answered with apparent satisfaction. The patient knows to call the clinic with any problems, questions or concerns.  I spent 60 minutes counseling the patient face to face. The total time spent in the appointment was 80 minutes and more than 50% was on counseling and direct patient cares and co-ordinating cares with the ED physician.    Sullivan Lone MD [place] AAHIVMS [facility] Hematology/Oncology Physician [place]  (Office):  [phone] (Work cell):  [phone] (Fax):           [phone]  11/28/2017 5:07 PM
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
## 2017-11-28 NOTE — H&P (Signed)
Date: 11/28/2017               Patient Name:  Chelsea Clements MRN: [id number]  DOB: 1961-04-19 Age / Sex: 56 y.o., female   PCP: Remi Haggard, FNP         Medical Service: Internal Medicine Teaching Service         Attending Physician: Dr. Lucious Groves, DO    First Contact: Dr. Maricela Bo Pager: [phone]  Second Contact: Dr. Jari Favre Pager: [phone]       After Hours (After 5p/  First Contact Pager: [phone]  weekends / holidays): Second Contact Pager: [phone]   Chief Complaint: abdominal pain   History of Present Illness:   This is a 55 year old patient with epilepsy, HTN, depression, HLD who presents with 5 days of generalized diffuse abdominal pain. She had gone to her PCP on Wednesday, and they had ordered a CT scan of her abdomen yesterday, and her results came back showing multiple ill defined lesions in the liver, as well as a mass involving the head of the pancreas.  She describes that her abdominal pain started 6 days ago, and it was initially periumbilical, and then it migrated upwards, and then spread outwards to both flanks. The pain is sharp and is intermittent. She has tried taking tylenol and it did not really help. She said that on Monday she tried to eat a bowl of sphagetti, as she was very hungry, but she did not really tolerate it as she had abdominal pain afterwards. The pain radiates to her back.  She also says that she noticed her stool was dark since Wednesday, and this was new as she denies any presence of melena or hematochezia prior to 2 days ago. She has associated anorexia alternating with feeling of hunger but as soon as she eats she has abd pain.  She describes that she has about 18 pound weight loss from 1 year ago to now. She used to weight 154 pounds and today she is 138 lbs. She only noticed this difference when she went to her primary care's office 2 days ago but said she did not put '2 and 2 together'.She denies fevers, chills, night sweats,  nausea, vomiting, diarrhea, constipation, dizziness, or any other symptoms.  She also has epilepsy, since she was young and takes lamotrigine for it. She describes that she had febrile seizures when she was young, and this changed to grand mal seizures until 2004, and then they switched to focal epilepsy, but it has been well controlled on the current AED.  She takes simvastatin and fenofibrate for her HLD, and amlodipine for her HTN. Denies taking mobic. Denies taking any other medicines.  Family history: father had small cell lung cancer at the age of 47, mother died from ventricular tachycardia, heart disease runs in the family.  SH: She  Lives with an autistic child, and she is stressed about that. She lives with an ex-husband of 17 years. Has 3 sisters with great support system. Most of her kids live in [place].  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 She denies heavy alcohol use- occasional beer a day, She denies illicit drug use.  However, ER note states that PCP reported patient has alcohol abuse disorder.  Meds:  Current Meds  Medication Sig  . acetaminophen (TYLENOL) 500 MG tablet Take 1,000 mg by mouth every 4 (four) hours as needed for mild pain.  Marland Kitchen amLODipine (NORVASC) 5 MG tablet Take 5 mg by mouth daily.  . fenofibrate (TRICOR) 145 MG tablet Take 145 mg by mouth daily.  Marland Kitchen lamoTRIgine (LAMICTAL) 100 MG tablet Take 100 mg by mouth 2 (two) times daily.  Marland Kitchen lamoTRIgine (LAMICTAL) 25 MG tablet Take 50 mg by mouth every morning.  . Menthol-Camphor (ICY HOT ARTHRITIS PAIN RELIEF EX) Apply 1 application topically daily as needed (for pain).  . simvastatin (ZOCOR) 20 MG tablet Take 20 mg by mouth at bedtime.     Allergies: Allergies as of 11/28/2017 - Review Complete 11/28/2017  Allergen Reaction Noted  . Latex Rash  10/17/2016   Past Medical History:  Diagnosis Date  . Epilepsy ([place])   . Hypertension      Review of Systems: A complete ROS was negative except as per HPI.   Physical Exam: Blood pressure (!) 122/91, pulse 94, temperature 99.1 F (37.3 C), resp. rate (!) 24, height 5' 3.5" (1.613 m), weight 138 lb (62.6 kg), SpO2 99 %.  General:  A&O, somewhat in pain when I examined her- morphine was not yet given   HENT: PERRLA, no ear/nose/throat erythema or exudates EYes: EOMI, no nystagmus (though pt says she has nystagmus), PERRLA, no conj pallor  Neck: supple, no lymphadenopathy, no axillary lymphadenopathy  CV: Regular rate, regular rhythm, no murmurs or rubs appreciated. Resp: equal and symmetric breath sounds, no wheezing or crackles. Abdomen: soft,  Diffusely tender to palpation, most prominently in the periumbilical area, some flank pain on both sides , positive bowel sounds  Skin: warm, dry, intact, no open lesions or atypical rash noted Extremities: pulses intact b/l, no edema Neurologic: Patient is alert and oriented, and no gross deficits noted    EKG: personally reviewed my interpretation is sinus tachycardia, LAD  CXR: personally reviewed my interpretation is- right cardiophrenic angle density- possibly pericardial cyst   Assessment & Plan by Problem: Active Problems:   Abdominal pain  Abdominal pain: most likely metastatic cancer with unknown primary , due to the presence of multiple ill defined masses found on the CT scan yesterday. Dr Tarri Abernethy in the ER talked with Radiology and they are trying to obtain the digital copy of the CT scan. He has also talked with Oncology who recommended admission, and possible biopsy. GI has also been consulted for evaluation of melena and possible endoscopic ultrasound guided biopsy of the pancreatic mass. Now also has melena. She does not have history of pancreatitis. Pt says she does not use heavy alcohol but chart suggests she may.    -consulted oncology and GI  in the ER -morphine PRN for pain -hepatic function panel ordered  -FOBT -follow up on the digital copy of the CT scan -further orders per recommendations of oncology and GI -will be NPO for now. -can possibly order CA 19-9 tumor marker but defer to oncology   History of epilepsy -continue lamotrigine  HTN -holding amlodipine as she is normotensive and has not taken amlodipine for a while   Depression- she was started on pristiq but has not picked up yet from pharmacy -CTM  F E N NPO  DVT ppx: lovenox    Dispo: Admit patient to Observation with expected length of stay less than 2 midnights.  Signed: Burgess Estelle, MD 11/28/2017, 1:00 PM

## 2017-11-28 NOTE — ED Notes (Signed)
Attempted to call report x 1  

## 2017-11-29 DIAGNOSIS — F419 Anxiety disorder, unspecified: Secondary | ICD-10-CM | POA: Diagnosis not present

## 2017-11-29 DIAGNOSIS — G893 Neoplasm related pain (acute) (chronic): Secondary | ICD-10-CM | POA: Diagnosis present

## 2017-11-29 DIAGNOSIS — Z8701 Personal history of pneumonia (recurrent): Secondary | ICD-10-CM | POA: Diagnosis not present

## 2017-11-29 DIAGNOSIS — F329 Major depressive disorder, single episode, unspecified: Secondary | ICD-10-CM | POA: Diagnosis present

## 2017-11-29 DIAGNOSIS — R1013 Epigastric pain: Secondary | ICD-10-CM | POA: Diagnosis not present

## 2017-11-29 DIAGNOSIS — Z91040 Latex allergy status: Secondary | ICD-10-CM | POA: Diagnosis not present

## 2017-11-29 DIAGNOSIS — E785 Hyperlipidemia, unspecified: Secondary | ICD-10-CM | POA: Diagnosis present

## 2017-11-29 DIAGNOSIS — Z66 Do not resuscitate: Secondary | ICD-10-CM | POA: Diagnosis present

## 2017-11-29 DIAGNOSIS — R509 Fever, unspecified: Secondary | ICD-10-CM | POA: Diagnosis not present

## 2017-11-29 DIAGNOSIS — I252 Old myocardial infarction: Secondary | ICD-10-CM | POA: Diagnosis not present

## 2017-11-29 DIAGNOSIS — K649 Unspecified hemorrhoids: Secondary | ICD-10-CM | POA: Diagnosis present

## 2017-11-29 DIAGNOSIS — K921 Melena: Secondary | ICD-10-CM | POA: Diagnosis present

## 2017-11-29 DIAGNOSIS — R1032 Left lower quadrant pain: Secondary | ICD-10-CM | POA: Diagnosis not present

## 2017-11-29 DIAGNOSIS — K449 Diaphragmatic hernia without obstruction or gangrene: Secondary | ICD-10-CM | POA: Diagnosis present

## 2017-11-29 DIAGNOSIS — K869 Disease of pancreas, unspecified: Secondary | ICD-10-CM | POA: Diagnosis not present

## 2017-11-29 DIAGNOSIS — K8689 Other specified diseases of pancreas: Secondary | ICD-10-CM | POA: Diagnosis not present

## 2017-11-29 DIAGNOSIS — Z79899 Other long term (current) drug therapy: Secondary | ICD-10-CM | POA: Diagnosis not present

## 2017-11-29 DIAGNOSIS — I10 Essential (primary) hypertension: Secondary | ICD-10-CM | POA: Diagnosis present

## 2017-11-29 DIAGNOSIS — E440 Moderate protein-calorie malnutrition: Secondary | ICD-10-CM | POA: Diagnosis present

## 2017-11-29 DIAGNOSIS — G40409 Other generalized epilepsy and epileptic syndromes, not intractable, without status epilepticus: Secondary | ICD-10-CM | POA: Diagnosis present

## 2017-11-29 DIAGNOSIS — R109 Unspecified abdominal pain: Secondary | ICD-10-CM | POA: Diagnosis not present

## 2017-11-29 DIAGNOSIS — C250 Malignant neoplasm of head of pancreas: Secondary | ICD-10-CM | POA: Diagnosis present

## 2017-11-29 DIAGNOSIS — F1010 Alcohol abuse, uncomplicated: Secondary | ICD-10-CM | POA: Diagnosis present

## 2017-11-29 DIAGNOSIS — C787 Secondary malignant neoplasm of liver and intrahepatic bile duct: Secondary | ICD-10-CM | POA: Diagnosis present

## 2017-11-29 DIAGNOSIS — C259 Malignant neoplasm of pancreas, unspecified: Secondary | ICD-10-CM | POA: Diagnosis not present

## 2017-11-29 DIAGNOSIS — R1084 Generalized abdominal pain: Secondary | ICD-10-CM | POA: Diagnosis present

## 2017-11-29 DIAGNOSIS — K769 Liver disease, unspecified: Secondary | ICD-10-CM | POA: Diagnosis not present

## 2017-11-29 DIAGNOSIS — M549 Dorsalgia, unspecified: Secondary | ICD-10-CM | POA: Diagnosis not present

## 2017-11-29 DIAGNOSIS — G40909 Epilepsy, unspecified, not intractable, without status epilepticus: Secondary | ICD-10-CM | POA: Diagnosis not present

## 2017-11-29 DIAGNOSIS — Z801 Family history of malignant neoplasm of trachea, bronchus and lung: Secondary | ICD-10-CM | POA: Diagnosis not present

## 2017-11-29 DIAGNOSIS — F410 Panic disorder [episodic paroxysmal anxiety] without agoraphobia: Secondary | ICD-10-CM | POA: Diagnosis present

## 2017-11-29 DIAGNOSIS — Z6824 Body mass index (BMI) 24.0-24.9, adult: Secondary | ICD-10-CM | POA: Diagnosis not present

## 2017-11-29 DIAGNOSIS — F17210 Nicotine dependence, cigarettes, uncomplicated: Secondary | ICD-10-CM | POA: Diagnosis present

## 2017-11-29 DIAGNOSIS — R630 Anorexia: Secondary | ICD-10-CM | POA: Diagnosis not present

## 2017-11-29 DIAGNOSIS — F411 Generalized anxiety disorder: Secondary | ICD-10-CM | POA: Diagnosis present

## 2017-11-29 DIAGNOSIS — J449 Chronic obstructive pulmonary disease, unspecified: Secondary | ICD-10-CM | POA: Diagnosis present

## 2017-11-29 LAB — COMPREHENSIVE METABOLIC PANEL
ALBUMIN: 3.4 g/dL — AB (ref 3.5–5.0)
ALK PHOS: 100 U/L (ref 38–126)
ALT: 25 U/L (ref 14–54)
ANION GAP: 14 (ref 5–15)
AST: 29 U/L (ref 15–41)
BILIRUBIN TOTAL: 0.9 mg/dL (ref 0.3–1.2)
BUN: <5 mg/dL — ABNORMAL LOW (ref 6–20)
CO2: 24 mmol/L (ref 22–32)
Calcium: 9.5 mg/dL (ref 8.9–10.3)
Chloride: 96 mmol/L — ABNORMAL LOW (ref 101–111)
Creatinine, Ser: 0.62 mg/dL (ref 0.44–1.00)
GFR calc Af Amer: >60 mL/min (ref >60)
GFR calc non Af Amer: >60 mL/min (ref >60)
GLUCOSE: 70 mg/dL (ref 65–99)
Potassium: 3.8 mmol/L (ref 3.5–5.1)
Sodium: 134 mmol/L — ABNORMAL LOW (ref 135–145)
TOTAL PROTEIN: 6.9 g/dL (ref 6.5–8.1)

## 2017-11-29 LAB — CBC
HEMATOCRIT: 42.1 % (ref 36.0–46.0)
HEMOGLOBIN: 14 g/dL (ref 12.0–15.0)
MCH: 32.4 pg (ref 26.0–34.0)
MCHC: 33.3 g/dL (ref 30.0–36.0)
MCV: 97.5 fL (ref 78.0–100.0)
Platelets: 302 10*3/uL (ref 150–400)
RBC: 4.32 MIL/uL (ref 3.87–5.11)
RDW: 11.9 % (ref 11.5–15.5)
WBC: 7.9 10*3/uL (ref 4.0–10.5)

## 2017-11-29 LAB — HIV ANTIBODY (ROUTINE TESTING W REFLEX): HIV SCREEN 4TH GENERATION: NONREACTIVE

## 2017-11-29 LAB — GLUCOSE, CAPILLARY: Glucose-Capillary: 87 mg/dL (ref 65–99)

## 2017-11-29 MED ORDER — PANTOPRAZOLE SODIUM 40 MG IV SOLR
40.0000 mg | Freq: Two times a day (BID) | INTRAVENOUS | Status: DC
  Administered 2017-11-29 – 2017-11-30 (×4): 40 mg via INTRAVENOUS
  Filled 2017-11-29 (×4): qty 40

## 2017-11-29 MED ORDER — HYDROXYZINE HCL 10 MG PO TABS
10.0000 mg | ORAL_TABLET | Freq: Once | ORAL | Status: AC
  Administered 2017-11-29: 10 mg via ORAL
  Filled 2017-11-29: qty 1

## 2017-11-29 MED ORDER — ENOXAPARIN SODIUM 40 MG/0.4ML ~~LOC~~ SOLN
40.0000 mg | Freq: Once | SUBCUTANEOUS | Status: AC
  Administered 2017-11-29: 40 mg via SUBCUTANEOUS
  Filled 2017-11-29: qty 0.4

## 2017-11-29 MED ORDER — NICOTINE 21 MG/24HR TD PT24
21.0000 mg | MEDICATED_PATCH | Freq: Every day | TRANSDERMAL | Status: DC
  Administered 2017-11-29 – 2017-12-02 (×4): 21 mg via TRANSDERMAL
  Filled 2017-11-29 (×5): qty 1

## 2017-11-29 MED ORDER — MORPHINE SULFATE (PF) 4 MG/ML IV SOLN
2.0000 mg | INTRAVENOUS | Status: DC | PRN
  Administered 2017-11-29 – 2017-11-30 (×5): 4 mg via INTRAVENOUS
  Filled 2017-11-29 (×5): qty 1

## 2017-11-29 MED ORDER — SODIUM CHLORIDE 0.9 % IV SOLN
INTRAVENOUS | Status: AC
  Administered 2017-11-29: 21:00:00 via INTRAVENOUS

## 2017-11-29 MED ORDER — SODIUM CHLORIDE 0.9 % IV SOLN
INTRAVENOUS | Status: DC
  Administered 2017-11-29: 10:00:00 via INTRAVENOUS

## 2017-11-29 MED ORDER — BOOST / RESOURCE BREEZE PO LIQD CUSTOM
1.0000 | Freq: Three times a day (TID) | ORAL | Status: DC
  Administered 2017-11-29 – 2017-12-02 (×2): 1 via ORAL

## 2017-11-29 MED ORDER — MORPHINE SULFATE (PF) 4 MG/ML IV SOLN
2.0000 mg | Freq: Once | INTRAVENOUS | Status: AC
  Administered 2017-11-29: 2 mg via INTRAVENOUS
  Filled 2017-11-29: qty 1

## 2017-11-29 NOTE — Plan of Care (Signed)
  Education: Knowledge of General Education information will improve 11/29/2017 0106 - Progressing by Renata Caprice, RN

## 2017-11-29 NOTE — Progress Notes (Signed)
Oncology Short Note   Elevated Lipase and AMylase with some clinical symptoms suggestive of pancreatitis ?. (pain with po intake) GI consultation ? Pancreatic duct obstruction ? Need for PD stenting. Gradual diet reintroduction based on symptoms and pancreatitis status. Pain mx per hospitalist team IR consulted for consider of Bx of Liver mass if possible  CT chest Awaiting tumor marker results and Biopsy results.  Sullivan Lone MD MS

## 2017-11-29 NOTE — Progress Notes (Signed)
Initial Nutrition Assessment  DOCUMENTATION CODES:   Non-severe (moderate) malnutrition in context of chronic illness  INTERVENTION:    Boost Breeze po TID, each supplement provides 250 kcal and 9 grams of protein  NUTRITION DIAGNOSIS:   Moderate Malnutrition related to catabolic illness(concern for pancreatic cancer with metastasis to liver, biopsy pending) as evidenced by energy intake < or equal to 75% for > or equal to 1 month, mild muscle depletion, mild fat depletion, percent weight loss  GOAL:   Patient will meet greater than or equal to 90% of their needs  MONITOR:   PO intake, Supplement acceptance, Labs, Skin  REASON FOR ASSESSMENT:   Malnutrition Screening Tool  ASSESSMENT:   56 yo Female who presented with 5 days of abdominal pain, 20 pound weight loss over one year, no jaundice, and normal LFTs. Outside imaging with reported liver metastases and a pancreatic head mass. There is no tissue diagnosis yet nor an official CT report but malignancy is a concern.  Pt reports she had not eaten for 1 week PTA due to pain. Prior to this time her PO intake has been variable for the last 6-8 months. Some days she would eat well and other days she wouldn't consume anything.  Pt also reveals she's lost about 20 lbs (13%) within the same time frame. Doesn't like Ensure or Boost supplements but agreeable to try Boost Breeze. Labs and medications reviewed. Na 134 (L). CBG 87.  NUTRITION - FOCUSED PHYSICAL EXAM:    Most Recent Value  Orbital Region  No depletion  Upper Arm Region  Mild depletion  Thoracic and Lumbar Region  No depletion  Buccal Region  Mild depletion  Temple Region  Mild depletion  Clavicle Bone Region  No depletion  Clavicle and Acromion Bone Region  No depletion  Scapular Bone Region  No depletion  Dorsal Hand  No depletion  Patellar Region  No depletion  Anterior Thigh Region  No depletion  Posterior Calf Region  No depletion  Edema (RD Assessment)   None     Diet Order:  Diet full liquid Room service appropriate? Yes; Fluid consistency: Thin Diet NPO time specified Diet NPO time specified Except for: Sips with Meds  EDUCATION NEEDS:   No education needs have been identified at this time  Skin:  Skin Assessment: Reviewed RN Assessment  Last BM:  12/12  Height:   Ht Readings from Last 1 Encounters:  11/28/17 5' 3.5" (1.613 m)    Weight:   Wt Readings from Last 1 Encounters:  11/28/17 138 lb (62.6 kg)    Ideal Body Weight:  52.2 kg  BMI:  Body mass index is 24.06 kg/m.  Estimated Nutritional Needs:   Kcal:  1900-2100  Protein:  100-115 gm  Fluid:  1.9-2.1 L  Arthur Holms, RD, LDN Pager #: [phone] After-Hours Pager #: [phone]

## 2017-11-29 NOTE — Consult Note (Signed)
Chief Complaint: Patient was seen in consultation today for image guided liver lesion biopsy Chief Complaint  Patient presents with  . Abdominal Pain  . Chest Pain    Referring Physician(s): Kale,G/Butcher,E  Supervising Physician: Sandi Mariscal  Patient Status: [facility] - In-pt  History of Present Illness: Chelsea Clements is a 56 y.o. female smoker with history of alcohol use, hypertension, COPD, seizure disorder who was admitted to the [facility] on 12/14 with persistent abdominal pain made worse with eating, back pain and tarry stools.  She has also had weight loss.  Outside imaging done in [place] revealed pancreatic and liver lesions.  Official report not available to review at this time.  She was also noted to have elevated amylase and lipase levels,?  associated pancreatitis.  Request now received from oncology and primary care service for image guided liver biopsy for further evaluation.  Past Medical History:  Diagnosis Date  . Abdominal mass    CT scan of abdomen 11/27/2017, and her results came back CT showing multiple ill defined lesions in the liver, as well as a mass involving the head of the pancreas. hx/notes 11/28/2017  . Anxiety   . COPD (chronic obstructive pulmonary disease) ([place])   . Depression   . Epilepsy ([place])    febrile seizures when she was young, and this changed to grand mal seizures until 2004, and then they switched to focal epilepsy, but it has been well controlled on the current AED/notes 11/28/2017  . HLD (hyperlipidemia)   . Hypertension   . Liver cancer ([place]) dx'd 11/28/2017  . Myocardial infarction [facility])    [name] 2017; "AMI; age undetermined" (11/28/2017)  . Pancreas cancer ([place]) dx'd 11/28/2017  . Pneumonia ~ 2012X 1    Past Surgical History:  Procedure Laterality Date  . TONSILLECTOMY      Allergies: Latex  Medications: Prior to Admission medications   Medication Sig Start Date End Date Taking? Authorizing Provider  acetaminophen  (TYLENOL) 500 MG tablet Take 1,000 mg by mouth every 4 (four) hours as needed for mild pain.   Yes [provider]  amLODipine (NORVASC) 5 MG tablet Take 5 mg by mouth daily.   Yes [provider]  fenofibrate (TRICOR) 145 MG tablet Take 145 mg by mouth daily.   Yes [provider]  lamoTRIgine (LAMICTAL) 100 MG tablet Take 100 mg by mouth 2 (two) times daily.   Yes [provider]  lamoTRIgine (LAMICTAL) 25 MG tablet Take 50 mg by mouth every morning.   Yes [provider]  Menthol-Camphor (ICY HOT ARTHRITIS PAIN RELIEF EX) Apply 1 application topically daily as needed (for pain).   Yes [provider]  simvastatin (ZOCOR) 20 MG tablet Take 20 mg by mouth at bedtime.   Yes [provider]  meloxicam (MOBIC) 7.5 MG tablet Take 1 tablet (7.5 mg total) by mouth daily. Patient not taking: Reported on 11/28/2017 11/29/16 11/29/17  Lannie Fields, PA-C  ondansetron (ZOFRAN ODT) 4 MG disintegrating tablet Take 1 tablet (4 mg total) by mouth every 8 (eight) hours as needed for nausea or vomiting. Patient not taking: Reported on 11/28/2017 10/18/16   Gregor Hams, MD     History reviewed. No pertinent family history.  Social History   Socioeconomic History  . Marital status: Divorced    Spouse name: None  . Number of children: None  . Years of education: None  . Highest education level: None  Social Needs  . Financial resource strain: None  .  Food insecurity - worry: None  . Food insecurity - inability: None  . Transportation needs - medical: None  . Transportation needs - non-medical: None  Occupational History  . None  Tobacco Use  . Smoking status: Current Every Day Smoker    Packs/day: 1.00    Years: 40.00    Pack years: 40.00    Types: Cigarettes  . Smokeless tobacco: Never Used  Substance and Sexual Activity  . Alcohol use: Yes    Alcohol/week: 6.0 oz    Types: 10 Cans of beer per week  . Drug use: No  .  Sexual activity: No  Other Topics Concern  . None  Social History Narrative  . None      Review of Systems see above: Denies fever, headache, chest pain, dyspnea, cough, nausea, vomiting.  Vital Signs: BP 130/65 (BP Location: Left Arm)   Pulse 79   Temp 98.1 F (36.7 C) (Oral)   Resp 20   Ht 5' 3.5" (1.613 m)   Wt 138 lb (62.6 kg)   SpO2 95%   BMI 24.06 kg/m   Physical Exam awake, alert.  Chest clear to auscultation bilaterally.  Heart with regular rate and rhythm.  Abdomen soft, positive bowel sounds, moderate epigastric tenderness to palpation.  No lower extremity edema.  Imaging: Dg Chest 2 View  Result Date: 11/28/2017 CLINICAL DATA:  Chest pain. EXAM: CHEST  2 VIEW COMPARISON:  No recent prior. FINDINGS: Mediastinum hilar structures are normal. Right cardiophrenic angle density is noted. This may represent a process such as a pericardial cyst. Mild left base subsegmental atelectasis. No pleural effusion pneumothorax. Contrast in the colon. IMPRESSION: 1. Right cardiophrenic angle density is noted. This may represent a a process such as a pericardial cyst. 2. Mild left base subsegmental atelectasis. Electronically Signed   By: Marcello Moores  Register   On: 11/28/2017 11:02    Labs:  CBC: Recent Labs    11/28/17 1048 11/29/17 0647  WBC 8.9 7.9  HGB 14.2 14.0  HCT 42.2 42.1  PLT 310 302    COAGS: Recent Labs    11/28/17 2117  INR 1.08  APTT 28    BMP: Recent Labs    11/28/17 1048 11/29/17 0647  NA 135 134*  K 3.4* 3.8  CL 99* 96*  CO2 23 24  GLUCOSE 98 70  BUN 5* <5*  CALCIUM 9.3 9.5  CREATININE 0.61 0.62  GFRNONAA >60 >60  GFRAA >60 >60    LIVER FUNCTION TESTS: Recent Labs    11/28/17 1048 11/29/17 0647  BILITOT 0.7 0.9  AST 40 29  ALT 29 25  ALKPHOS 100 100  PROT 6.9 6.9  ALBUMIN 3.5 3.4*    TUMOR MARKERS: No results for input(s): AFPTM, CEA, CA199, CHROMGRNA in the last 8760 hours.  Assessment and Plan: 56 y.o. female smoker with  history of alcohol use, hypertension, COPD, seizure disorder who was admitted to the [facility] on 12/14 with persistent abdominal pain made worse with eating, back pain and tarry stools.  She has also had weight loss.  Outside imaging done in [place] revealed pancreatic and liver lesions.  Official report not available to review at this time.  She was also noted to have elevated amylase and lipase levels,?  associated pancreatitis.  Request now received from oncology and primary care service for image guided liver biopsy for further evaluation.  Outside imaging studies have been reviewed but official report not available .  Left hepatic lobe lesion appears amenable to  percutaneous biopsy.Risks and benefits discussed with the patient including, but not limited to bleeding, infection, damage to adjacent structures or low yield requiring additional tests.All of the patient's questions were answered, patient is agreeable to proceed.Consent signed and in chart. Procedure tent scheduled for 12/17.     Thank you for this interesting consult.  I greatly enjoyed meeting Chelsea Clements and look forward to participating in their care.  A copy of this report was sent to the requesting provider on this date.  Electronically Signed: D. Rowe Robert, PA-C 11/29/2017, 2:23 PM   I spent a total of  30 minutes   in face to face in clinical consultation, greater than 50% of which was counseling/coordinating care for image guided liver lesion biopsy

## 2017-11-29 NOTE — Care Management Obs Status (Signed)
[place] NOTIFICATION   Patient Details  Name: Chelsea Clements MRN: [id number] Date of Birth: [birth year]   Medicare Observation Status Notification Given:  Yes    Carles Collet, RN 11/29/2017, 3:19 PM

## 2017-11-29 NOTE — H&P (View-Only) (Signed)
Consultation  Referring Provider:  Internal medicine teaching service/ [place] MD Primary Care Physician:  Remi Haggard, FNP Primary Gastroenterologist:  None -unassigned  Reason for Consultation:  Dark stool, abdominal pain  HPI: Chelsea Clements is a 56 y.o. 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 She's not had any prior GI evaluation. She says she has had onset of acute upper abdominal pain this past Sunday which was initially more of an annoying discomfort in the umbilical area which then migrated to the epigastrium and over to the left upper quadrant and into her back. She says it became progressively worse through this week to the point where she couldn't take it anymore. She has not had any associated nausea or vomiting no fever or chills. She experienced significant increase in pain with by mouth intake She also had an episode of a very tarry black stool  On Tuesday, 11/25/2017. She had another episode of black greenish stool following day and has not had any bowel movements since. She has not been taking any iron or Pepto-Bismol. She saw her primary care physician on 11/26/2017 and subsequently had CT of the abdomen and pelvis ordered which was done at [facility] in [place]. This returned showing multiple ill-defined lesions in the liver the largest 2.4 cm and a mass in the head of the pancreas measuring 4.8 x 4.3 cm. These images are available to review but we do not have a formal report. Labs done here showed amylase of 248, lipase 362, normal LFTs, hemoglobin 14.2 hematocrit of 46 and platelets in 300 range. Repeat hemoglobin today stable at 14 Patient has been evaluated by oncology/Dr. Irene Limbo.Patient is aware of her diagnosis, and is already thinking about whether or not she will want to go through any treatment.  CT-guided biopsy per IR of the liver or pancreas has been requested, tumor markers are pending, and CT of the chest was also advised.  She continues to have significant upper abdominal pain which is most consistent with acute pancreatitis, pain is being managed with narcotics which are helpful. She is very hungry but acknowledges that she does have increasing pain especially with solid food. Patient has not been on any aspirin or NSAIDs.   Past Medical History:  Diagnosis Date  . Abdominal mass    CT scan of abdomen 11/27/2017, and her results came back CT showing multiple ill defined lesions in the liver, as well as a mass involving the head of the pancreas. hx/notes 11/28/2017  . Anxiety   . COPD (chronic obstructive pulmonary disease) ([place])   . Depression   . 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 HLD (hyperlipidemia)   . Hypertension   . Liver cancer ([place]) dx'd 11/28/2017  . Myocardial infarction [facility])    [name] 2017; "AMI; age undetermined" (11/28/2017)  . Pancreas cancer ([place]) dx'd 11/28/2017  . Pneumonia ~ 2012X 1    Past Surgical History:  Procedure Laterality Date  . TONSILLECTOMY      Prior to Admission medications   Medication Sig Start Date End Date Taking? Authorizing Provider  acetaminophen (TYLENOL) 500 MG tablet Take 1,000 mg by mouth every 4 (four) hours as needed for mild pain.  Yes [provider]  amLODipine (NORVASC) 5 MG tablet Take 5 mg by mouth daily.   Yes [provider]  fenofibrate (TRICOR) 145 MG tablet Take 145 mg by mouth daily.   Yes [provider]  lamoTRIgine (LAMICTAL) 100 MG tablet Take 100 mg by mouth 2 (two) times daily.   Yes [provider]  lamoTRIgine (LAMICTAL) 25 MG tablet Take 50 mg by mouth every morning.   Yes [provider]  Menthol-Camphor (ICY HOT ARTHRITIS PAIN RELIEF EX) Apply 1 application topically daily as needed (for pain).   Yes [provider]  simvastatin (ZOCOR) 20 MG tablet Take 20 mg by mouth at bedtime.   Yes [provider]  meloxicam (MOBIC) 7.5 MG tablet Take 1 tablet (7.5 mg total) by mouth daily. Patient not taking: Reported on 11/28/2017 11/29/16 11/29/17  Lannie Fields, PA-C  ondansetron (ZOFRAN ODT) 4 MG disintegrating tablet Take 1 tablet (4 mg total) by mouth every 8 (eight) hours as needed for nausea or vomiting. Patient not taking: Reported on 11/28/2017 10/18/16   Gregor Hams, MD    Current Facility-Administered Medications  Medication Dose Route Frequency Provider Last Rate Last Dose  . 0.9 %  sodium chloride infusion   Intravenous Continuous Alphonzo Grieve, MD 75 mL/hr at 11/29/17 1013    . acetaminophen (TYLENOL) tablet 650 mg  650 mg Oral Q6H PRN Burgess Estelle, MD       Or  . acetaminophen (TYLENOL) suppository 650 mg  650 mg Rectal Q6H PRN Burgess Estelle, MD      . enoxaparin (LOVENOX) injection 40 mg  40 mg Subcutaneous Q24H Burgess Estelle, MD      . lamoTRIgine (LAMICTAL) tablet 100 mg  100 mg Oral BID Burgess Estelle, MD   100 mg at 11/29/17 1006  . morphine 4 MG/ML injection 2-4 mg  2-4 mg Intravenous Q4H PRN Alphonzo Grieve, MD      . nicotine (NICODERM CQ - dosed in mg/24 hours) patch 21 mg  21 mg Transdermal Once Ina Homes, MD   21 mg at 11/28/17 1259  . promethazine (PHENERGAN) tablet 12.5 mg  12.5 mg Oral Q6H PRN Burgess Estelle, MD   12.5 mg at 11/28/17 2203  . sodium chloride flush (NS) 0.9 % injection 3 mL  3 mL Intravenous Q12H Burgess Estelle, MD   3 mL at 11/29/17 1007    Allergies as of 11/28/2017 - Review Complete 11/28/2017  Allergen Reaction Noted  . Latex Rash 10/17/2016    History reviewed. No pertinent family history.  Social History   Socioeconomic History  . Marital status: Divorced    Spouse name: Not  on file  . Number of children: Not on file  . Years of education: Not on file  . Highest education level: Not on file  Social Needs  . Financial resource strain: Not on file  . Food insecurity - worry: Not on file  . Food insecurity - inability: Not on file  . Transportation needs - medical: Not on file  . Transportation needs - non-medical: Not on file  Occupational History  . Not on file  Tobacco Use  . Smoking status: Current Every Day Smoker    Packs/day: 1.00    Years: 40.00    Pack years: 40.00    Types: Cigarettes  . Smokeless tobacco: Never Used  Substance and Sexual Activity  . Alcohol use: Yes    Alcohol/week: 6.0 oz    Types: 10 Cans of  beer per week  . Drug use: No  . Sexual activity: No  Other Topics Concern  . Not on file  Social History Narrative  . Not on file    Review of Systems: Pertinent positive and negative review of systems were noted in the above HPI section.  All other review of systems was otherwise negative.  Physical Exam: Vital signs in last 24 hours: Temp:  [97.9 F (36.6 C)-98.8 F (37.1 C)] 98.1 F (36.7 C) (12/15 0600) Pulse Rate:  [79-94] 79 (12/15 0600) Resp:  [20-24] 20 (12/14 2043) BP: (122-144)/(65-91) 130/65 (12/15 0600) SpO2:  [95 %-99 %] 95 % (12/15 0600) Last BM Date: 11/26/17 General:   Alert,  Well-developed, well-nourished,thin WF, pleasant and cooperative in NAD Head:  Normocephalic and atraumatic. Eyes:  Sclera clear, no icterus.   Conjunctiva pink. Ears:  Normal auditory acuity. Nose:  No deformity, discharge,  or lesions. Mouth:  No deformity or lesions.   Neck:  Supple; no masses or thyromegaly. Lungs:  Clear throughout to auscultation.   No wheezes, crackles, or rhonchi. Heart:  Regular rate and rhythm; no murmurs, clicks, rubs,  or gallops. Abdomen:  Soft tender,across upper abdomen with some guarding no rebound no palpable mass or hepatosplenomegaly bowel sounds are present   Rectal:  Deferred -no bowel  movement since admit Msk:  Symmetrical without gross deformities. . Pulses:  Normal pulses noted. Extremities:  Without clubbing or edema. Neurologic:  Alert and  oriented x4;  grossly normal neurologically. Skin:  Intact without significant lesions or rashes.. Psych:  Alert and cooperative. Normal mood and affect.  Intake/Output from previous day: No intake/output data recorded. Intake/Output this shift: No intake/output data recorded.  Lab Results: Recent Labs    11/28/17 1048 11/29/17 0647  WBC 8.9 7.9  HGB 14.2 14.0  HCT 42.2 42.1  PLT 310 302   BMET Recent Labs    11/28/17 1048 11/29/17 0647  NA 135 134*  K 3.4* 3.8  CL 99* 96*  CO2 23 24  GLUCOSE 98 70  BUN 5* <5*  CREATININE 0.61 0.62  CALCIUM 9.3 9.5   LFT Recent Labs    11/28/17 1048 11/29/17 0647  PROT 6.9 6.9  ALBUMIN 3.5 3.4*  AST 40 29  ALT 29 25  ALKPHOS 100 100  BILITOT 0.7 0.9  BILIDIR 0.1  --   IBILI 0.6  --    PT/INR Recent Labs    11/28/17 2117  LABPROT 13.9  INR 1.08     IMPRESSION:  #45 56 year old white female with gradual weight loss of 18 pounds over the past year, and acute onset of upper abdominal pain radiating to the left abdomen and back 6 days ago which has become constant and somewhat progressive. Workup thus far concerning for metastatic pancreatic cancer with pancreatic head mass and multiple liver lesions noted on outside CT scan. She has elevated amylase and lipase here consistent with a component of acute pancreatitis.  #2 melena-patient had 2 episodes last week, hemoglobin remains normal at 14 no stools over the past couple of days. Rule out tumor invasion into the duodenum, peptic ulcer disease, gastropathy  #3 COPD #4 hypertension #5 history of seizure disorder  Plan; CT images are available in the PACS system, will request formal report I radiology here IV PPI twice daily low fat full liquid diet, patient very hungry she is advised if this increases her  pain to back off We will schedule for EGD tomorrow with Dr. Havery Moros. Procedure was  discussed in detail with the patient including indications risks and benefits and she is agreeable to proceed She will need IR consultation for biopsy/tissue diagnosis Pain management         11/29/2017, 12:03 PM

## 2017-11-29 NOTE — Progress Notes (Addendum)
  Date: 11/29/2017  Patient name: Chelsea Clements  Medical record number: [id number]  Date of birth: 22-Jul-1961   I have seen and evaluated Chelsea Clements and discussed their care with the Residency Team. Chelsea Clements is a 56 yo female with 5 days of abd pain - worst epigastric and goes through to back, anorexia bc eating made pain worse, and one year of 20 lb weight loss. Her PCP was concerned about GI blood loss (one day of melena) so obtained CT scan that showed a pancreatic mass in head and liver mets. This is by report only as official read not yet in Epic. Pt was told to come to the ED. Denies juandice.  PMHx, Fam Hx, and/or Soc Hx : No history of pancreatitis. Lives with youngest daughter who has autism, ex husband (moved in 4 months ago), and son. Retired Marine scientist. Drinks one beer each night. Might have a second beer for special ball games.  Vitals:   11/28/17 2043 11/29/17 0600  BP: (!) 144/75 130/65  Pulse: 84 79  Resp: 20   Temp: 97.9 F (36.6 C) 98.1 F (36.7 C)  SpO2: 95% 95%  NAD, sitting in bed Skin no juanduce, no erythema HEENT [place] AT, no icterus HRRR no MRG ABD + BS, + epigastric and L sided tenderness Ext no edema, warm, +2 DP pulses  I personally viewed the CXR images and confirmed my reading with the official read. PA and lateral blurring of L diaphragm and L heart border  I personally viewed the EKG and confirmed my reading with the official read. LAD, sinus, no ischemic changes  Lipase 362 T bilil 0.9 AST 29 ALT 25 Alk Phos 100 HgB 14 HIV negative  Assessment and Plan: I have seen and evaluated the patient as outlined above. I agree with the formulated Assessment and Plan as detailed in the residents' note, with the following changes: Chelsea Clements is a 56 year old woman who presented with 5 days of abdominal pain, 20 pound weight loss over one year, no jaundice, and normal LFTs. Outside imaging with reported liver metastases and a pancreatic head mass. There  is no tissue diagnosis yet nor an official CT report but malignancy is a concern. Pancreatic head cancers usually cause jaundice so it is surprising that she is without jaundice or an elevated total bili. She does have pain that radiates to the back which is common with pancreatic body or tail masses. We need an official read of her outside CT scan. Next out would then be a tissue biopsy, either liver via interventional radiology or pancreatic via GI EUS. We have consulted both GI and interventional radiology. The patient is aware of our concerns as is her family and she gives [place] our permission to discuss her medical issues if she has family in the room.  1. Appreciate oncology's recommendations 2. Consult GI and IR to obtain tissue diagnosis 3. Clear liquid diet 4. DVT prophylaxis with Lovenox 5. Patient is at very low risk for alcohol withdrawal so will not start CIWA protocol 6. She has already completed medical power of attorney, living will, she requests that DNR be suspended with any procedure 7. Pain control with IV opioids  Bartholomew Crews, MD 12/15/201811:27 AM

## 2017-11-29 NOTE — Progress Notes (Signed)
[employer], per on call service CT scan report can only be handled on Monday at the earliest.  Office is notified of pt's procedure on Monday afternoon and the need of records prior to the procedure  is preferred.

## 2017-11-29 NOTE — Consult Note (Signed)
Consultation  Referring Provider:  Internal medicine teaching service/ Parth MD Primary Care Physician:  Remi Haggard, FNP Primary Gastroenterologist:  None -unassigned  Reason for Consultation:  Dark stool, abdominal pain  HPI: Chelsea Clements is a 56 y.o. 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 She's not had any prior GI evaluation. She says she has had onset of acute upper abdominal pain this past Sunday which was initially more of an annoying discomfort in the umbilical area which then migrated to the epigastrium and over to the left upper quadrant and into her back. She says it became progressively worse through this week to the point where she couldn't take it anymore. She has not had any associated nausea or vomiting no fever or chills. She experienced significant increase in pain with by mouth intake She also had an episode of a very tarry black stool  On Tuesday, 11/25/2017. She had another episode of black greenish stool following day and has not had any bowel movements since. She has not been taking any iron or Pepto-Bismol. She saw her primary care physician on 11/26/2017 and subsequently had CT of the abdomen and pelvis ordered which was done at [facility] in [place]. This returned showing multiple ill-defined lesions in the liver the largest 2.4 cm and a mass in the head of the pancreas measuring 4.8 x 4.3 cm. These images are available to review but we do not have a formal report. Labs done here showed amylase of 248, lipase 362, normal LFTs, hemoglobin 14.2 hematocrit of 46 and platelets in 300 range. Repeat hemoglobin today stable at 14 Patient has been evaluated by oncology/Dr. Irene Limbo.Patient is aware of her diagnosis, and is already thinking about whether or not she will want to go through any treatment.  CT-guided biopsy per IR of the liver or pancreas has been requested, tumor markers are pending, and CT of the chest was also advised.  She continues to have significant upper abdominal pain which is most consistent with acute pancreatitis, pain is being managed with narcotics which are helpful. She is very hungry but acknowledges that she does have increasing pain especially with solid food. Patient has not been on any aspirin or NSAIDs.   Past Medical History:  Diagnosis Date  . Abdominal mass    CT scan of abdomen 11/27/2017, and her results came back CT showing multiple ill defined lesions in the liver, as well as a mass involving the head of the pancreas. hx/notes 11/28/2017  . Anxiety   . COPD (chronic obstructive pulmonary disease) ([place])   . Depression   . Epilepsy ([place])    febrile seizures when she was young, and this changed to grand mal seizures until 2004, and then they switched to focal epilepsy, but it has been well controlled on the current AED/notes 11/28/2017  . HLD (hyperlipidemia)   . Hypertension   . Liver cancer ([place]) dx'd 11/28/2017  . Myocardial infarction [facility])    [name] 2017; "AMI; age undetermined" (11/28/2017)  . Pancreas cancer ([place]) dx'd 11/28/2017  . Pneumonia ~ 2012X 1    Past Surgical History:  Procedure Laterality Date  . TONSILLECTOMY      Prior to Admission medications   Medication Sig Start Date End Date Taking? Authorizing Provider  acetaminophen (TYLENOL) 500 MG tablet Take 1,000 mg by mouth every 4 (four) hours as needed for mild pain.  Yes [provider]  amLODipine (NORVASC) 5 MG tablet Take 5 mg by mouth daily.   Yes [provider]  fenofibrate (TRICOR) 145 MG tablet Take 145 mg by mouth daily.   Yes [provider]  lamoTRIgine (LAMICTAL) 100 MG tablet Take 100 mg by mouth 2 (two) times daily.   Yes [provider]  lamoTRIgine (LAMICTAL) 25 MG tablet Take 50 mg by mouth every morning.   Yes [provider]  Menthol-Camphor (ICY HOT ARTHRITIS PAIN RELIEF EX) Apply 1 application topically daily as needed (for pain).   Yes [provider]  simvastatin (ZOCOR) 20 MG tablet Take 20 mg by mouth at bedtime.   Yes [provider]  meloxicam (MOBIC) 7.5 MG tablet Take 1 tablet (7.5 mg total) by mouth daily. Patient not taking: Reported on 11/28/2017 11/29/16 11/29/17  Lannie Fields, PA-C  ondansetron (ZOFRAN ODT) 4 MG disintegrating tablet Take 1 tablet (4 mg total) by mouth every 8 (eight) hours as needed for nausea or vomiting. Patient not taking: Reported on 11/28/2017 10/18/16   Gregor Hams, MD    Current Facility-Administered Medications  Medication Dose Route Frequency Provider Last Rate Last Dose  . 0.9 %  sodium chloride infusion   Intravenous Continuous Alphonzo Grieve, MD 75 mL/hr at 11/29/17 1013    . acetaminophen (TYLENOL) tablet 650 mg  650 mg Oral Q6H PRN Burgess Estelle, MD       Or  . acetaminophen (TYLENOL) suppository 650 mg  650 mg Rectal Q6H PRN Burgess Estelle, MD      . enoxaparin (LOVENOX) injection 40 mg  40 mg Subcutaneous Q24H Burgess Estelle, MD      . lamoTRIgine (LAMICTAL) tablet 100 mg  100 mg Oral BID Burgess Estelle, MD   100 mg at 11/29/17 1006  . morphine 4 MG/ML injection 2-4 mg  2-4 mg Intravenous Q4H PRN Alphonzo Grieve, MD      . nicotine (NICODERM CQ - dosed in mg/24 hours) patch 21 mg  21 mg Transdermal Once Ina Homes, MD   21 mg at 11/28/17 1259  . promethazine (PHENERGAN) tablet 12.5 mg  12.5 mg Oral Q6H PRN Burgess Estelle, MD   12.5 mg at 11/28/17 2203  . sodium chloride flush (NS) 0.9 % injection 3 mL  3 mL Intravenous Q12H Burgess Estelle, MD   3 mL at 11/29/17 1007    Allergies as of 11/28/2017 - Review Complete 11/28/2017  Allergen Reaction Noted  . Latex Rash 10/17/2016    History reviewed. No pertinent family history.  Social History   Socioeconomic History  . Marital status: Divorced    Spouse name: Not  on file  . Number of children: Not on file  . Years of education: Not on file  . Highest education level: Not on file  Social Needs  . Financial resource strain: Not on file  . Food insecurity - worry: Not on file  . Food insecurity - inability: Not on file  . Transportation needs - medical: Not on file  . Transportation needs - non-medical: Not on file  Occupational History  . Not on file  Tobacco Use  . Smoking status: Current Every Day Smoker    Packs/day: 1.00    Years: 40.00    Pack years: 40.00    Types: Cigarettes  . Smokeless tobacco: Never Used  Substance and Sexual Activity  . Alcohol use: Yes    Alcohol/week: 6.0 oz    Types: 10 Cans of  beer per week  . Drug use: No  . Sexual activity: No  Other Topics Concern  . Not on file  Social History Narrative  . Not on file    Review of Systems: Pertinent positive and negative review of systems were noted in the above HPI section.  All other review of systems was otherwise negative.  Physical Exam: Vital signs in last 24 hours: Temp:  [97.9 F (36.6 C)-98.8 F (37.1 C)] 98.1 F (36.7 C) (12/15 0600) Pulse Rate:  [79-94] 79 (12/15 0600) Resp:  [20-24] 20 (12/14 2043) BP: (122-144)/(65-91) 130/65 (12/15 0600) SpO2:  [95 %-99 %] 95 % (12/15 0600) Last BM Date: 11/26/17 General:   Alert,  Well-developed, well-nourished,thin WF, pleasant and cooperative in NAD Head:  Normocephalic and atraumatic. Eyes:  Sclera clear, no icterus.   Conjunctiva pink. Ears:  Normal auditory acuity. Nose:  No deformity, discharge,  or lesions. Mouth:  No deformity or lesions.   Neck:  Supple; no masses or thyromegaly. Lungs:  Clear throughout to auscultation.   No wheezes, crackles, or rhonchi. Heart:  Regular rate and rhythm; no murmurs, clicks, rubs,  or gallops. Abdomen:  Soft tender,across upper abdomen with some guarding no rebound no palpable mass or hepatosplenomegaly bowel sounds are present   Rectal:  Deferred -no bowel  movement since admit Msk:  Symmetrical without gross deformities. . Pulses:  Normal pulses noted. Extremities:  Without clubbing or edema. Neurologic:  Alert and  oriented x4;  grossly normal neurologically. Skin:  Intact without significant lesions or rashes.. Psych:  Alert and cooperative. Normal mood and affect.  Intake/Output from previous day: No intake/output data recorded. Intake/Output this shift: No intake/output data recorded.  Lab Results: Recent Labs    11/28/17 1048 11/29/17 0647  WBC 8.9 7.9  HGB 14.2 14.0  HCT 42.2 42.1  PLT 310 302   BMET Recent Labs    11/28/17 1048 11/29/17 0647  NA 135 134*  K 3.4* 3.8  CL 99* 96*  CO2 23 24  GLUCOSE 98 70  BUN 5* <5*  CREATININE 0.61 0.62  CALCIUM 9.3 9.5   LFT Recent Labs    11/28/17 1048 11/29/17 0647  PROT 6.9 6.9  ALBUMIN 3.5 3.4*  AST 40 29  ALT 29 25  ALKPHOS 100 100  BILITOT 0.7 0.9  BILIDIR 0.1  --   IBILI 0.6  --    PT/INR Recent Labs    11/28/17 2117  LABPROT 13.9  INR 1.08     IMPRESSION:  #78 56 year old white female with gradual weight loss of 18 pounds over the past year, and acute onset of upper abdominal pain radiating to the left abdomen and back 6 days ago which has become constant and somewhat progressive. Workup thus far concerning for metastatic pancreatic cancer with pancreatic head mass and multiple liver lesions noted on outside CT scan. She has elevated amylase and lipase here consistent with a component of acute pancreatitis.  #2 melena-patient had 2 episodes last week, hemoglobin remains normal at 14 no stools over the past couple of days. Rule out tumor invasion into the duodenum, peptic ulcer disease, gastropathy  #3 COPD #4 hypertension #5 history of seizure disorder  Plan; CT images are available in the PACS system, will request formal report I radiology here IV PPI twice daily low fat full liquid diet, patient very hungry she is advised if this increases her  pain to back off We will schedule for EGD tomorrow with Dr. Havery Moros. Procedure was  discussed in detail with the patient including indications risks and benefits and she is agreeable to proceed She will need IR consultation for biopsy/tissue diagnosis Pain management      Misty Foutz  11/29/2017, 12:03 PM

## 2017-11-29 NOTE — Progress Notes (Signed)
   Subjective: Patient doing okay this AM. She states that her abdominal pain started about six days ago and started near her umbilicus then to her epigastric low sternal border. It radiates to the LLQ and to her back. The pain was managed well overnight on morphine.She has an appetite and is interested in eating. She denies vomiting, CP, SOB, or fevers. She has not had a bowel movement since being hospitalized, but was noticing melena as an outpatient. Denies hematochezia or episodes of hematemesis. She denies jaundice or prior bouts of pancreatitis or vomiting.  Her only request was to have a diet today and to manage her pain adequately.   Objective:  Vital signs in last 24 hours: Vitals:   11/28/17 1345 11/28/17 1457 11/28/17 2043 11/29/17 0600  BP: 127/82 137/80 (!) 144/75 130/65  Pulse: 94 94 84 79  Resp: (!) 21 20 20    Temp:  98.8 F (37.1 C) 97.9 F (36.6 C) 98.1 F (36.7 C)  TempSrc:  Oral Oral Oral  SpO2: 99% 96% 95% 95%  Weight:      Height:       General: Laying in bed comfortably, NAD HEENT: [place]/AT, EOMI, no scleral icterus, PERRL Cardiac: RRR, No R/M/G appreciated Pulm: normal effort, CTAB Abd: soft, tender in epigastric region and LLQ, non distended, BS normal Ext: extremities well perfused, no peripheral edema Neuro: alert and oriented X3, cranial nerves II-XII grossly intact   Assessment/Plan:  Active Problems:   Abdominal pain   Pancreatic mass   Liver metastases ([facility]CC)   Loss of weight  Pancreatic mass, multiple liver lesions  Abdominal pain, gradual weight loss CT abdomen performed at OSH revealed multiple ill defined liver lesions and a mass in the head of the pancreas measuring 4.8 x 4.3 cm, concerning for pancreatic cancer with metastasis to liver.  IR, GI, and oncology on board. LFTs were WNL. Lipase elevated to 362. Pain has been well controlled on current regimen. She has not had a bowel movement since hospitalized. Does exhibit abdominal tenderness on  exam in the epigastric area and LLQ.  -Appreciate recs of GI, Onc, and IR -CT-guided biopsy per IR of the liver appear amenable to percutaneous biopsy pending MC receives final read of outside films -Tumor markers are pending -CT of the chest requested per onc  -Continue morphine 2-4 mg q4 hours PRN for pain -FOBT pending  -Liquid diet today, NPO after midnight pending planned procedures  Melena Patient reported dark tarry stools. No BRBPR. Hgb stable at 14. No previous history of GERD or gastritis. FOBT pending as patient has not had BM. -GI consulted, recommended Protonix and possible EGD AM  History of epilepsy -Continue lamotrigine  Major Depressive Disorder Mood stable today. Patient seems to be in relatively good spirits. As an outpatient was started on pristiq, but has not picked up the prescription from pharmacy.  -Will closely monitor mood, especially in the setting of likely cancer diagonsis   Dispo: Anticipated discharge in approximately 2-3 days.   Melanee Spry, MD 11/29/2017, 2:38 PM Pager: [phone]

## 2017-11-30 ENCOUNTER — Encounter (HOSPITAL_COMMUNITY): Payer: Self-pay | Admitting: Certified Registered"

## 2017-11-30 ENCOUNTER — Inpatient Hospital Stay (HOSPITAL_COMMUNITY): Payer: Medicare Other | Admitting: Certified Registered"

## 2017-11-30 ENCOUNTER — Encounter (HOSPITAL_COMMUNITY)
Admission: EM | Disposition: A | Discharge status: Home / Self Care | Payer: Self-pay | Source: Home / Self Care | Attending: Internal Medicine

## 2017-11-30 DIAGNOSIS — K449 Diaphragmatic hernia without obstruction or gangrene: Secondary | ICD-10-CM

## 2017-11-30 HISTORY — PX: ESOPHAGOGASTRODUODENOSCOPY (EGD) WITH PROPOFOL: SHX5813

## 2017-11-30 LAB — CBC
HEMATOCRIT: 40.9 % (ref 36.0–46.0)
Hemoglobin: 13.5 g/dL (ref 12.0–15.0)
MCH: 32.2 pg (ref 26.0–34.0)
MCHC: 33 g/dL (ref 30.0–36.0)
MCV: 97.6 fL (ref 78.0–100.0)
Platelets: 293 10*3/uL (ref 150–400)
RBC: 4.19 MIL/uL (ref 3.87–5.11)
RDW: 12.1 % (ref 11.5–15.5)
WBC: 7.4 10*3/uL (ref 4.0–10.5)

## 2017-11-30 LAB — COMPREHENSIVE METABOLIC PANEL
ALK PHOS: 87 U/L (ref 38–126)
ALT: 23 U/L (ref 14–54)
ANION GAP: 10 (ref 5–15)
AST: 43 U/L — ABNORMAL HIGH (ref 15–41)
Albumin: 3.2 g/dL — ABNORMAL LOW (ref 3.5–5.0)
BUN: <5 mg/dL — ABNORMAL LOW (ref 6–20)
CALCIUM: 9 mg/dL (ref 8.9–10.3)
CO2: 23 mmol/L (ref 22–32)
CREATININE: 0.7 mg/dL (ref 0.44–1.00)
Chloride: 100 mmol/L — ABNORMAL LOW (ref 101–111)
Glucose, Bld: 88 mg/dL (ref 65–99)
Potassium: 3.6 mmol/L (ref 3.5–5.1)
SODIUM: 133 mmol/L — AB (ref 135–145)
Total Bilirubin: 0.7 mg/dL (ref 0.3–1.2)
Total Protein: 6.4 g/dL — ABNORMAL LOW (ref 6.5–8.1)

## 2017-11-30 LAB — CANCER ANTIGEN 19-9: CAN 19-9: 23669 U/mL — AB (ref 0–35)

## 2017-11-30 LAB — CEA: CEA1: 18.6 ng/mL — AB (ref 0.0–4.7)

## 2017-11-30 LAB — GLUCOSE, CAPILLARY: GLUCOSE-CAPILLARY: 93 mg/dL (ref 65–99)

## 2017-11-30 LAB — AFP TUMOR MARKER: AFP, SERUM, TUMOR MARKER: 3.7 ng/mL (ref 0.0–8.3)

## 2017-11-30 LAB — LIPASE, BLOOD: LIPASE: 269 U/L — AB (ref 11–51)

## 2017-11-30 LAB — AMYLASE: AMYLASE: 182 U/L — AB (ref 28–100)

## 2017-11-30 SURGERY — ESOPHAGOGASTRODUODENOSCOPY (EGD) WITH PROPOFOL
Anesthesia: Monitor Anesthesia Care

## 2017-11-30 MED ORDER — LIDOCAINE 2% (20 MG/ML) 5 ML SYRINGE
INTRAMUSCULAR | Status: DC | PRN
  Administered 2017-11-30: 50 mg via INTRAVENOUS

## 2017-11-30 MED ORDER — PROPOFOL 500 MG/50ML IV EMUL
INTRAVENOUS | Status: DC | PRN
Start: 2017-11-30 — End: 2017-11-30
  Administered 2017-11-30: 150 ug/kg/min via INTRAVENOUS

## 2017-11-30 MED ORDER — LACTATED RINGERS IV SOLN
INTRAVENOUS | Status: AC | PRN
  Administered 2017-11-30: 1000 mL via INTRAVENOUS

## 2017-11-30 MED ORDER — HYDROMORPHONE HCL 1 MG/ML IJ SOLN
0.5000 mg | INTRAMUSCULAR | Status: DC | PRN
  Administered 2017-11-30: 1 mg via INTRAVENOUS
  Administered 2017-11-30: 0.5 mg via INTRAVENOUS
  Administered 2017-11-30: 1 mg via INTRAVENOUS
  Administered 2017-12-01: 0.5 mg via INTRAVENOUS
  Administered 2017-12-01: 1 mg via INTRAVENOUS
  Administered 2017-12-01: 0.5 mg via INTRAVENOUS
  Administered 2017-12-01 – 2017-12-02 (×4): 1 mg via INTRAVENOUS
  Filled 2017-11-30 (×10): qty 1

## 2017-11-30 SURGICAL SUPPLY — 14 items

## 2017-11-30 NOTE — Anesthesia Postprocedure Evaluation (Signed)
Anesthesia Post Note  Patient: Chelsea Clements  Procedure(s) Performed: ESOPHAGOGASTRODUODENOSCOPY (EGD) WITH PROPOFOL (N/A )     Patient location during evaluation: PACU Anesthesia Type: MAC Level of consciousness: awake and alert Pain management: pain level controlled Vital Signs Assessment: post-procedure vital signs reviewed and stable Respiratory status: spontaneous breathing, nonlabored ventilation, respiratory function stable and patient connected to nasal cannula oxygen Cardiovascular status: stable and blood pressure returned to baseline Postop Assessment: no apparent nausea or vomiting Anesthetic complications: no    Last Vitals:  Vitals:   11/30/17 1215 11/30/17 1220  BP: (!) 107/55 130/71  Pulse:  70  Resp: 16 10  Temp:    SpO2: 93% 94%    Last Pain:  Vitals:   11/30/17 1200  TempSrc: Oral  PainSc:                  Athanasius Kesling DAVID

## 2017-11-30 NOTE — Progress Notes (Signed)
Oncology short note  Chart and labs reviewed. US guided biopsy of Liver and EGD planned and currently pending. Tumor marker results and CT chest --pending Will f/u pending above workup.  Sullivan Lone MD MS

## 2017-11-30 NOTE — Transfer of Care (Signed)
Immediate Anesthesia Transfer of Care Note  Patient: Chelsea Clements  Procedure(s) Performed: ESOPHAGOGASTRODUODENOSCOPY (EGD) WITH PROPOFOL (N/A )  Patient Location: Endoscopy Unit  Anesthesia Type:MAC  Level of Consciousness: sedated and responds to stimulation  Airway & Oxygen Therapy: Patient Spontanous Breathing and Patient connected to nasal cannula oxygen  Post-op Assessment: Report given to RN and Post -op Vital signs reviewed and stable  Post vital signs: Reviewed and stable  Last Vitals:  Vitals:   11/30/17 1059 11/30/17 1200  BP: 124/72 (!) 83/39  Pulse: 71 69  Resp: 16 11  Temp: 37.1 C   SpO2: 97% 93%    Last Pain:  Vitals:   11/30/17 1059  TempSrc: Oral  PainSc: 3       Patients Stated Pain Goal: 2 (60/15/61 5379)  Complications: No apparent anesthesia complications

## 2017-11-30 NOTE — Progress Notes (Signed)
   Subjective:  Patient endorses continued epigastric pain with radiation to the back; she states that the morphine will usually last an hour. She denies that eating yesterday exacerbated her pain. She has not had BM yet but has been passing flatus.   Objective:  Vital signs in last 24 hours: Vitals:   11/29/17 0600 11/29/17 1700 11/29/17 2100 11/30/17 0441  BP: 130/65 (!) 151/82 106/89 119/68  Pulse: 79 91 86 85  Resp:  20 18 16   Temp: 98.1 F (36.7 C) 98.4 F (36.9 C) 98.6 F (37 C) 98.6 F (37 C)  TempSrc: Oral Oral Oral Oral  SpO2: 95% 97% 96% 95%  Weight:      Height:       Constitutional: mild discomfort CV: RRR, no murmurs, rubs or gallops Resp: CTAB, no increased work of breathing Abd: soft, epigastric tenderness, +BSs Ext: moves all 4 freely, no le edema  Assessment/Plan:  Active Problems:   Abdominal pain   Pancreatic mass   Liver metastases (HCC)   Loss of weight   Melena  Pancreatic mass, multiple liver lesions  Abdominal pain, gradual weight loss CT abdomen performed at OSH revealed multiple ill defined liver lesions and a mass in the head of the pancreas measuring 4.8 x 4.3 cm, concerning for pancreatic cancer with metastasis to liver. IR, GI, and oncology on board. LFTs were WNL. Lipase is downtrending; amylase is downtrending.She has not had a bowel movement since hospitalized. Does exhibit abdominal tenderness on exam in the epigastric area. --EGD with GI today --tentatively scheduled liver biopsy with IR 12/17 if official read of OSH imaging is able to be obtained --will plan on CT chest this admission --f/u tumor markers --change to dilaudid 0.5-1mg  q4hr prn and adjust as needed for adequate pain control; stool softeners  Melena: No BMs this admission, going to EGD today. --EGD --protonix IV BID  History of epilepsy: --continue lamotrigine  MDD: Continues to be in good spirits. Has not started taking recently prescribed pristiq. --continue  monitoring for now  Dispo: Anticipated discharge in approximately 2-3day(s).   Alphonzo Grieve, MD 11/30/2017, 8:50 AM Pager [phone]

## 2017-11-30 NOTE — Anesthesia Preprocedure Evaluation (Signed)
Anesthesia Evaluation  Patient identified by MRN, date of birth, ID band Patient awake    Reviewed: Allergy & Precautions, NPO status   Airway Mallampati: I  TM Distance: >3 FB Neck ROM: Full    Dental   Pulmonary Current Smoker,    Pulmonary exam normal        Cardiovascular hypertension, Pt. on medications Normal cardiovascular exam     Neuro/Psych Seizures -, Well Controlled,  Anxiety Depression    GI/Hepatic   Endo/Other    Renal/GU      Musculoskeletal   Abdominal   Peds  Hematology   Anesthesia Other Findings   Reproductive/Obstetrics                             Anesthesia Physical Anesthesia Plan  ASA: III  Anesthesia Plan: MAC   Post-op Pain Management:    Induction: Intravenous  PONV Risk Score and Plan: 1 and Treatment may vary due to age or medical condition  Airway Management Planned: Simple Face Mask  Additional Equipment:   Intra-op Plan:   Post-operative Plan:   Informed Consent: I have reviewed the patients History and Physical, chart, labs and discussed the procedure including the risks, benefits and alternatives for the proposed anesthesia with the patient or authorized representative who has indicated his/her understanding and acceptance.     Plan Discussed with: CRNA and Surgeon  Anesthesia Plan Comments:         Anesthesia Quick Evaluation

## 2017-11-30 NOTE — Interval H&P Note (Signed)
History and Physical Interval Note:  11/30/2017 11:27 AM  Chelsea Clements  has presented today for surgery, with the diagnosis of melena  The various methods of treatment have been discussed with the patient and family. After consideration of risks, benefits and other options for treatment, the patient has consented to  Procedure(s): ESOPHAGOGASTRODUODENOSCOPY (EGD) WITH PROPOFOL (N/A) as a surgical intervention .  The patient's history has been reviewed, patient examined, no change in status, stable for surgery.  I have reviewed the patient's chart and labs.  Questions were answered to the patient's satisfaction.     [place]

## 2017-11-30 NOTE — Op Note (Signed)
[facility] Patient Name: Chelsea Clements Procedure Date : 11/30/2017 MRN: [id number] Attending MD: Carlota Raspberry. Dinia Joynt MD, MD Date of Birth: 1961/09/24 CSN: [id number] Age: 56 Admit Type: Inpatient Procedure:                Upper GI endoscopy Indications:              Melena, suspected pancreatic cancer Providers:                Carlota Raspberry. Jovonda Selner MD, MD, Angus Seller,                            William Dalton, Technician Referring MD:              Medicines:                Monitored Anesthesia Care Complications:            No immediate complications. Estimated blood loss:                            None. Estimated Blood Loss:     Estimated blood loss: none. Procedure:                Pre-Anesthesia Assessment:                           - Prior to the procedure, a History and Physical                            was performed, and patient medications and                            allergies were reviewed. The patient's tolerance of                            previous anesthesia was also reviewed. The risks                            and benefits of the procedure and the sedation                            options and risks were discussed with the patient.                            All questions were answered, and informed consent                            was obtained. Prior Anticoagulants: The patient has                            taken no previous anticoagulant or antiplatelet                            agents. ASA Grade Assessment: III - A patient with  severe systemic disease. After reviewing the risks                            and benefits, the patient was deemed in                            satisfactory condition to undergo the procedure.                           After obtaining informed consent, the endoscope was                            passed under direct vision. Throughout the                            procedure, the  patient's blood pressure, pulse, and                            oxygen saturations were monitored continuously. The                            was introduced through the mouth, and advanced to                            the second part of duodenum. The upper GI endoscopy                            was accomplished without difficulty. The patient                            tolerated the procedure well. Scope In: Scope Out: Findings:      Esophagogastric landmarks were identified: the Z-line was found at 34       cm, the gastroesophageal junction was found at 34 cm and the upper       extent of the gastric folds was found at 40 cm from the incisors.      A 6 cm hiatal hernia was present. There appeared to be associated       nonbleeding Cameron lesions.      The exam of the esophagus was otherwise normal.      The entire examined stomach was normal. The stomach did not insufflate       well due to the large hiatal hernia. No pathology noted other than small       Cameron lesions to cause bleeding. The examined duodenum was mostly       normal, some edematous tissue in the sweep versus extrinsice       compression, but overlying mucosa appeared normal. I did not visualize       the ampulla. Impression:               - Esophagogastric landmarks identified.                           - 6 cm hiatal hernia with associated small [name]  lesions. Perhaps this is the cause of her prior                            symptoms.                           - Normal stomach.                           - Duodenum as above Moderate Sedation:      No moderate sedation, case performed with MAC Recommendation:           - Return patient to hospital ward for ongoing care.                           - Resume previous diet.                           - Continue present medications.                           - Continue protonix 35m twice daily PO for now                           - Await  liver biopsy tomorrow. If liver biopsy is                            nondiagnostic, we can coordinate outpatient EUS for                            biopsy of the pancreas                           - GI will sign off for now, please call with                            additional questions Procedure Code(s):        --- Professional ---                           [id number] Esophagogastroduodenoscopy, flexible,                            transoral; diagnostic, including collection of                            specimen(s) by brushing or washing, when performed                            (separate procedure) Diagnosis Code(s):        --- Professional ---                           K44.9, Diaphragmatic hernia without obstruction or                            gangrene  K92.1, Melena (includes Hematochezia) CPT copyright 2016 [employer]. All rights reserved. The codes documented in this report are preliminary and upon coder review may  be revised to meet current compliance requirements. Remo Lipps P. Byron Tipping MD, MD 11/30/2017 12:22:14 PM This report has been signed electronically. Number of Addenda: 0

## 2017-12-01 ENCOUNTER — Inpatient Hospital Stay (HOSPITAL_COMMUNITY): Payer: Medicare Other

## 2017-12-01 DIAGNOSIS — R509 Fever, unspecified: Secondary | ICD-10-CM

## 2017-12-01 DIAGNOSIS — R1900 Intra-abdominal and pelvic swelling, mass and lump, unspecified site: Secondary | ICD-10-CM | POA: Diagnosis present

## 2017-12-01 LAB — GLUCOSE, CAPILLARY: GLUCOSE-CAPILLARY: 103 mg/dL — AB (ref 65–99)

## 2017-12-01 LAB — COMPREHENSIVE METABOLIC PANEL
ALT: 22 U/L (ref 14–54)
AST: 38 U/L (ref 15–41)
Albumin: 2.6 g/dL — ABNORMAL LOW (ref 3.5–5.0)
Alkaline Phosphatase: 78 U/L (ref 38–126)
Anion gap: 8 (ref 5–15)
BUN: <5 mg/dL — ABNORMAL LOW (ref 6–20)
CHLORIDE: 100 mmol/L — AB (ref 101–111)
CO2: 26 mmol/L (ref 22–32)
Calcium: 8.7 mg/dL — ABNORMAL LOW (ref 8.9–10.3)
Creatinine, Ser: 0.7 mg/dL (ref 0.44–1.00)
GFR calc Af Amer: >60 mL/min (ref >60)
GFR calc non Af Amer: >60 mL/min (ref >60)
GLUCOSE: 190 mg/dL — AB (ref 65–99)
POTASSIUM: 3.4 mmol/L — AB (ref 3.5–5.1)
Sodium: 134 mmol/L — ABNORMAL LOW (ref 135–145)
Total Bilirubin: 0.6 mg/dL (ref 0.3–1.2)
Total Protein: 5.4 g/dL — ABNORMAL LOW (ref 6.5–8.1)

## 2017-12-01 LAB — URINALYSIS, COMPLETE (UACMP) WITH MICROSCOPIC
Bacteria, UA: NONE SEEN
Bilirubin Urine: NEGATIVE
Glucose, UA: 50 mg/dL — AB
KETONES UR: NEGATIVE mg/dL
Leukocytes, UA: NEGATIVE
Nitrite: NEGATIVE
PROTEIN: NEGATIVE mg/dL
Specific Gravity, Urine: 1.003 — ABNORMAL LOW (ref 1.005–1.030)
pH: 6 (ref 5.0–8.0)

## 2017-12-01 LAB — CBC
HCT: 36.3 % (ref 36.0–46.0)
Hemoglobin: 12 g/dL (ref 12.0–15.0)
MCH: 31.9 pg (ref 26.0–34.0)
MCHC: 33.1 g/dL (ref 30.0–36.0)
MCV: 96.5 fL (ref 78.0–100.0)
PLATELETS: 243 10*3/uL (ref 150–400)
RBC: 3.76 MIL/uL — ABNORMAL LOW (ref 3.87–5.11)
RDW: 12.3 % (ref 11.5–15.5)
WBC: 7.2 10*3/uL (ref 4.0–10.5)

## 2017-12-01 MED ORDER — GELATIN ABSORBABLE 12-7 MM EX MISC
CUTANEOUS | Status: AC
  Filled 2017-12-01: qty 1

## 2017-12-01 MED ORDER — HYDROMORPHONE HCL 1 MG/ML IJ SOLN
INTRAMUSCULAR | Status: AC
  Filled 2017-12-01: qty 0.5

## 2017-12-01 MED ORDER — FENTANYL CITRATE (PF) 100 MCG/2ML IJ SOLN
INTRAMUSCULAR | Status: AC
  Filled 2017-12-01: qty 2

## 2017-12-01 MED ORDER — PANTOPRAZOLE SODIUM 40 MG PO TBEC
40.0000 mg | DELAYED_RELEASE_TABLET | Freq: Two times a day (BID) | ORAL | Status: DC
  Administered 2017-12-01 – 2017-12-02 (×4): 40 mg via ORAL
  Filled 2017-12-01 (×5): qty 1

## 2017-12-01 MED ORDER — LIDOCAINE-EPINEPHRINE 1 %-1:100000 IJ SOLN
INTRAMUSCULAR | Status: AC
  Filled 2017-12-01: qty 1

## 2017-12-01 MED ORDER — MIDAZOLAM HCL 2 MG/2ML IJ SOLN
INTRAMUSCULAR | Status: AC
  Filled 2017-12-01: qty 2

## 2017-12-01 MED ORDER — IOPAMIDOL (ISOVUE-300) INJECTION 61%
INTRAVENOUS | Status: AC
  Administered 2017-12-01: 75 mL via INTRAVENOUS
  Filled 2017-12-01: qty 75

## 2017-12-01 NOTE — Progress Notes (Signed)
Oral temp of 101.1. PRN tylenol given. Made on call aware. Will continue to monitor.

## 2017-12-01 NOTE — Progress Notes (Signed)
  Date: 12/01/2017  Patient name: Chelsea Clements  Medical record number: [id number]  Date of birth: [birth year]   I have seen and evaluated this patient and I have discussed the plan of care with the house staff. Please see their note for complete details.  Oda Kilts, MD 12/01/2017, 2:23 PM

## 2017-12-01 NOTE — Progress Notes (Signed)
   Subjective:  Patient had a fever this AM recorded at 101.1. She denied the sensation of having a fever, chills, or body aches. She denies dysuria, but does state that she may have blood in her urine as her urine was orange in color, but no gross blood in urine. She also denies cough, shortness of breath, or chest pain.   Her abdominal pain is better controlled on current regimen. She states the dilaudid provides her pain relief for a longer duration. It does make her drowsy so she has been trying to take it less.  She continues to have an appetite and denies that food exacerbates her pain. She has had no nausea or vomiting.  She has not had BM yet but has been passing gas.   Objective:  Vital signs in last 24 hours: Vitals:   11/30/17 2138 12/01/17 0437 12/01/17 0613 12/01/17 1017  BP: 132/66 106/65  105/80  Pulse: 94 (!) 102  88  Resp: 10 20  18   Temp: 99.9 F (37.7 C) (!) 101.1 F (38.4 C) 98.8 F (37.1 C) 97.9 F (36.6 C)  TempSrc: Oral Oral Oral Oral  SpO2: 95% 92%  94%  Weight:      Height:       General: Laying in bed comfortably, NAD Cardiac: RRR, No R/M/G appreciated Pulm: normal effort, good aeration through out lung fields, fine crackles at the bases bilaterally  Abd: soft, non distended, BS normal, diffuse tenderness to palpation worse in epigastric area and LLQ, no suprapubic tenderness  Ext: extremities well perfused, no peripheral edema Neuro: alert and oriented X3, cranial nerves II-XII grossly intact  Assessment/Plan:  Active Problems:   Abdominal pain   Pancreatic mass   Liver metastases (HCC)   Loss of weight   Melena  Pancreatic mass, multiple liver lesions  Abdominal pain, gradual weight loss CT abdomen performed at OSH revealed multiple ill defined liver lesions and a mass in the head of the pancreas measuring 4.8 x 4.3 cm, concerning for pancreatic cancer with metastasis to liver. Tumor markers: AFP WNL, CEA elevated 18.6, CA 19-9 23,669.    -tentatively scheduled liver biopsy with IR 12/17 if official read of OSH imaging is able to be obtained; patient is having records faxed to the hospital today -Continue dilaudid 0.5-1mg  q4hr prn and ad just as needed for adequate pain control; stool softeners -appreciate oncology and IR recs -will plan on CT chest this admission -GI to follow up after liver biopsy, if not enough tissue obtained will do EUS as outpatient   Fever UA negative for infection. CBC within normal limits. Patient having no other symptoms concerning for underlying infection. May be due to atelectasis as patient had fine crackles on exam today. Blood cultures still pending. -Will continue to monitor -Encouraged incentive spirometry   Melena: No BMs this admission. EGD revealed hiatal hernia with cameron lesions, but no active bleeding. GI recommends PO Protonix 40 mg BID.  -GI s/o   History of epilepsy: --continue lamotrigine  MDD: Continues to be in good spirits. Has not started taking recently prescribed pristiq. --continue monitoring for now  DVT ppx: -SCDs for now, currently holding anticoagulation pending procedures   Dispo: Anticipated discharge in approximately 2-3day(s).   Melanee Spry, MD 12/01/2017, 11:55 AM Pager [phone]

## 2017-12-01 NOTE — Sedation Documentation (Signed)
Patient is resting comfortably. 

## 2017-12-01 NOTE — Sedation Documentation (Signed)
Vital signs stable. 

## 2017-12-01 NOTE — Procedures (Signed)
Pre Procedure Dx: Liver lesion Post Procedural Dx: Same  Technically successful US guided biopsy of indeterminate lesion within the subcapsular aspect of the right lobe of the liver.  EBL: None No immediate complications.   Jay Isolde Skaff, MD Pager #: [phone]    

## 2017-12-02 DIAGNOSIS — R109 Unspecified abdominal pain: Secondary | ICD-10-CM

## 2017-12-02 DIAGNOSIS — E440 Moderate protein-calorie malnutrition: Secondary | ICD-10-CM

## 2017-12-02 DIAGNOSIS — C259 Malignant neoplasm of pancreas, unspecified: Secondary | ICD-10-CM

## 2017-12-02 DIAGNOSIS — G893 Neoplasm related pain (acute) (chronic): Secondary | ICD-10-CM

## 2017-12-02 LAB — COMPREHENSIVE METABOLIC PANEL
ALT: 22 U/L (ref 14–54)
AST: 29 U/L (ref 15–41)
Albumin: 3.1 g/dL — ABNORMAL LOW (ref 3.5–5.0)
Alkaline Phosphatase: 89 U/L (ref 38–126)
Anion gap: 7 (ref 5–15)
BUN: <5 mg/dL — ABNORMAL LOW (ref 6–20)
CHLORIDE: 101 mmol/L (ref 101–111)
CO2: 26 mmol/L (ref 22–32)
CREATININE: 0.63 mg/dL (ref 0.44–1.00)
Calcium: 8.9 mg/dL (ref 8.9–10.3)
GFR calc Af Amer: >60 mL/min (ref >60)
Glucose, Bld: 134 mg/dL — ABNORMAL HIGH (ref 65–99)
Potassium: 3.8 mmol/L (ref 3.5–5.1)
SODIUM: 134 mmol/L — AB (ref 135–145)
Total Bilirubin: 0.9 mg/dL (ref 0.3–1.2)
Total Protein: 6.6 g/dL (ref 6.5–8.1)

## 2017-12-02 LAB — CBC
HCT: 39.4 % (ref 36.0–46.0)
Hemoglobin: 13.1 g/dL (ref 12.0–15.0)
MCH: 32.7 pg (ref 26.0–34.0)
MCHC: 33.2 g/dL (ref 30.0–36.0)
MCV: 98.3 fL (ref 78.0–100.0)
PLATELETS: 255 10*3/uL (ref 150–400)
RBC: 4.01 MIL/uL (ref 3.87–5.11)
RDW: 12.5 % (ref 11.5–15.5)
WBC: 9.3 10*3/uL (ref 4.0–10.5)

## 2017-12-02 LAB — GLUCOSE, CAPILLARY: Glucose-Capillary: 127 mg/dL — ABNORMAL HIGH (ref 65–99)

## 2017-12-02 MED ORDER — DICLOFENAC SODIUM 1 % TD GEL
4.0000 g | Freq: Four times a day (QID) | TRANSDERMAL | Status: DC
  Administered 2017-12-02 (×3): 4 g via TOPICAL
  Filled 2017-12-02: qty 100

## 2017-12-02 MED ORDER — HYDROMORPHONE HCL 1 MG/ML IJ SOLN
0.5000 mg | INTRAMUSCULAR | Status: DC | PRN
  Administered 2017-12-02 (×2): 1 mg via INTRAVENOUS
  Filled 2017-12-02 (×2): qty 1

## 2017-12-02 MED ORDER — HYDROMORPHONE HCL 1 MG/ML IJ SOLN
0.5000 mg | INTRAMUSCULAR | Status: DC | PRN
  Administered 2017-12-03: 1 mg via INTRAVENOUS
  Filled 2017-12-02 (×2): qty 1

## 2017-12-02 MED ORDER — ENOXAPARIN SODIUM 40 MG/0.4ML ~~LOC~~ SOLN
40.0000 mg | SUBCUTANEOUS | Status: DC
  Administered 2017-12-02: 40 mg via SUBCUTANEOUS
  Filled 2017-12-02: qty 0.4

## 2017-12-02 MED ORDER — OXYCODONE HCL ER 10 MG PO T12A
20.0000 mg | EXTENDED_RELEASE_TABLET | Freq: Two times a day (BID) | ORAL | Status: DC
  Administered 2017-12-02: 20 mg via ORAL
  Filled 2017-12-02 (×2): qty 2

## 2017-12-02 MED ORDER — LORAZEPAM 2 MG/ML IJ SOLN
0.5000 mg | Freq: Four times a day (QID) | INTRAMUSCULAR | Status: DC | PRN
  Administered 2017-12-02: 0.5 mg via INTRAVENOUS
  Filled 2017-12-02: qty 1

## 2017-12-02 NOTE — Progress Notes (Signed)
Patient is anxious. Notified oncologist to verify prn order. Was able to give prn ativan per MD verbal order, will continue to monitor.

## 2017-12-02 NOTE — Progress Notes (Signed)
   Subjective:  [name] was experiencing significant abdominal pain this morning. Her abdominal pain is now worst on the right side. She also has been having right sided neck pain that she describes as a muscle spasm. Her neck pain started yesterday evening, and she had difficulty lying flat on the CT scan table because of the discomfort. She also had one episode of BRBPR this AM, which greatly concerned her. The blood was mainly on the toilet paper. She states it occurred with a small bowel movement. She denies history of hemorrhoids and has never had a colonoscopy before. She was emotional this morning discussing her plan of care. She was able to tolerate a regular diet this AM with abdominal pain, nausea, or vomiting.   She has been requiring dilaudid about every 4-6 hours, but states that today she felt it was not lasting as long as previously.   Objective:  Vital signs in last 24 hours: Vitals:   12/01/17 1500 12/01/17 1505 12/01/17 2120 12/02/17 0533  BP: (!) 89/57 (!) 98/58 136/79 98/61  Pulse: 84 83 93 (!) 108  Resp: 11 11 18 17   Temp:   98.8 F (37.1 C) (!) 100.6 F (38.1 C)  TempSrc:   Oral Oral  SpO2: 91% 92% 97% 94%  Weight:      Height:       General: Laying in bed, distressed with movement Cardiac: RRR, No R/M/G appreciated Pulm: normal effort, good aeration through out lung fields, crackles at the right base, bibasilar course breath sounds Abd: soft, non distended, BS normal, diffuse tenderness to palpation worse in epigastric area and RUQ   Ext: extremities well perfused, no peripheral edema Neuro: alert and oriented X3, cranial nerves II-XII grossly intact Psych: Emotional, crying throughout exam  Assessment/Plan:  Principal Problem:   Pancreatic mass Active Problems:   Abdominal pain   Liver metastases (HCC)   Loss of weight   Melena   Abdominal mass  Pancreatic mass, multiple liver lesions  Abdominal pain, gradual weight loss Ultrasound guided  biopsy of liver performed yesterday, pathology pending. Patient tolerated the procedure well. Her right sided abdominal pain is most likely related to the liver biopsy. CT chest with borderline enlarged mediastinal lymph node. Otherwise no definite evidence for metastatic disease to the chest.  -Increased dilaudid 0.5-1mg  q2hr prn; stool softeners -Ideally would like several hours of adequate pain control with above regimen, plan to add on PO pain meds later this afternoon -Will get in touch with oncology to discuss plan of care and dispo  -GI to follow up after liver pathology, if not enough tissue obtained will do EUS as outpatient   Fever Fever to 100.6 this AM. CBC within normal limits. Most likely post procedural, can also be related to atelectasis. Patient having no other symptoms concerning for underlying infection.  -Will continue to monitor -Encouraged incentive spirometry   BRBPR Most likely hemorrhoids. Patient as never had a colonoscopy or had hemorrhoids in the past.  -Continue to monitor  -FOBT ordered if patient has another BM  Melena: EGD revealed hiatal hernia with cameron lesions, but no active bleeding. Hemoglobin remains stable. -Protonix 40 mg BID  History of epilepsy: -Continue lamotrigine  DVT ppx: -SCDs for now, will add Lovenox after discussing plan of care with oncology.    Dispo: Anticipated discharge in approximately 1-2 day(s).   Melanee Spry, MD 12/02/2017, 9:07 AM Pager [phone]

## 2017-12-02 NOTE — Progress Notes (Signed)
[facility]   HEMATOLOGY/ONCOLOGY INPATIENT PROGRESS NOTE  Date of Service: 12/02/2017  Inpatient Attending: .Bartholomew Crews, MD   SUBJECTIVE   Patient was seen for oncologic f/u. We discussed in detailed for pathology results and new diagnosis of metastatic pancreatic cancer, natural history, prognosis and treatment approaches. Patient has been having a fair amount of anxiety and panic attacks. Notes some pain over the biopsy site. Improving control with pain mx. Now able to tolerate po intake.    OBJECTIVE:  NAD  PHYSICAL EXAMINATION: . Vitals:   12/01/17 1500 12/01/17 1505 12/01/17 2120 12/02/17 0533  BP: (!) 89/57 (!) 98/58 136/79 98/61  Pulse: 84 83 93 (!) 108  Resp: 11 11 18 17   Temp:   98.8 F (37.1 C) (!) 100.6 F (38.1 C)  TempSrc:   Oral Oral  SpO2: 91% 92% 97% 94%  Weight:      Height:       Filed Weights   11/28/17 1124 11/30/17 1059  Weight: 138 lb (62.6 kg) 138 lb (62.6 kg)   .Body mass index is 24.06 kg/m.  GENERAL:alert, in no acute distress and comfortable SKIN: skin color, texture, turgor are normal, no rashes or significant lesions EYES: normal, conjunctiva are pink and non-injected, sclera clear OROPHARYNX:no exudate, no erythema and lips, buccal mucosa, and tongue normal  NECK: supple, no JVD, thyroid normal size, non-tender, without nodularity LYMPH:  no palpable lymphadenopathy in the cervical, axillary or inguinal LUNGS: clear to auscultation with normal respiratory effort HEART: regular rate & rhythm,  no murmurs and no lower extremity edema ABDOMEN: abdomen soft, mild TTP  No Guarding/rigidity/rebound, normoactive bowel sounds  Musculoskeletal: no cyanosis of digits and no clubbing  PSYCH: alert & oriented x 3 with fluent speech NEURO: no focal motor/sensory deficits  MEDICAL HISTORY:  Past Medical History:  Diagnosis Date  . Abdominal mass    CT scan of abdomen 11/27/2017, and her results came back CT showing multiple ill defined  lesions in the liver, as well as a mass involving the head of the pancreas. hx/notes 11/28/2017  . Anxiety   . COPD (chronic obstructive pulmonary disease) ([place])   . Depression   . Epilepsy ([place])    febrile seizures when she was young, and this changed to grand mal seizures until 2004, and then they switched to focal epilepsy, but it has been well controlled on the current AED/notes 11/28/2017  . HLD (hyperlipidemia)   . Hypertension   . Liver cancer ([place]) dx'd 11/28/2017  . Myocardial infarction [facility])    [name] 2017; "AMI; age undetermined" (11/28/2017)  . Pancreas cancer ([place]) dx'd 11/28/2017  . Pneumonia ~ 2012X 1    SURGICAL HISTORY: Past Surgical History:  Procedure Laterality Date  . ESOPHAGOGASTRODUODENOSCOPY (EGD) WITH PROPOFOL N/A 11/30/2017   Procedure: ESOPHAGOGASTRODUODENOSCOPY (EGD) WITH PROPOFOL;  Surgeon: Yetta Flock, MD;  Location: [place];  Service: Gastroenterology;  Laterality: N/A;  . TONSILLECTOMY      SOCIAL HISTORY: Social History   Socioeconomic History  . Marital status: Divorced    Spouse name: Not on file  . Number of children: Not on file  . Years of education: Not on file  . Highest education level: Not on file  Social Needs  . Financial resource strain: Not on file  . Food insecurity - worry: Not on file  . Food insecurity - inability: Not on file  . Transportation needs - medical: Not on file  . Transportation needs - non-medical: Not on file  Occupational  History  . Not on file  Tobacco Use  . Smoking status: Current Every Day Smoker    Packs/day: 1.00    Years: 40.00    Pack years: 40.00    Types: Cigarettes  . Smokeless tobacco: Never Used  Substance and Sexual Activity  . Alcohol use: Yes    Alcohol/week: 6.0 oz    Types: 10 Cans of beer per week  . Drug use: No  . Sexual activity: No  Other Topics Concern  . Not on file  Social History Narrative  . Not on file    FAMILY HISTORY: History reviewed. No pertinent  family history.  ALLERGIES:  has No Known Allergies.  MEDICATIONS:  Scheduled Meds: . diclofenac sodium  4 g Topical QID  . feeding supplement  1 Container Oral TID BM  . lamoTRIgine  100 mg Oral BID  . nicotine  21 mg Transdermal Daily  . pantoprazole  40 mg Oral BID  . sodium chloride flush  3 mL Intravenous Q12H   Continuous Infusions: PRN Meds:.acetaminophen **OR** acetaminophen, HYDROmorphone (DILAUDID) injection, promethazine  REVIEW OF SYSTEMS:    10 Point review of Systems was done is negative except as noted above.   LABORATORY DATA:  I have reviewed the data as listed  . CBC Latest Ref Rng & Units 12/02/2017 12/01/2017 11/30/2017  WBC 4.0 - 10.5 K/uL 9.3 7.2 7.4  Hemoglobin 12.0 - 15.0 g/dL 13.1 12.0 13.5  Hematocrit 36.0 - 46.0 % 39.4 36.3 40.9  Platelets 150 - 400 K/uL 255 243 293    . CMP Latest Ref Rng & Units 12/02/2017 12/01/2017 11/30/2017  Glucose 65 - 99 mg/dL 134(H) 190(H) 88  BUN 6 - 20 mg/dL <5(L) <5(L) <5(L)  Creatinine 0.44 - 1.00 mg/dL 0.63 0.70 0.70  Sodium 135 - 145 mmol/L 134(L) 134(L) 133(L)  Potassium 3.5 - 5.1 mmol/L 3.8 3.4(L) 3.6  Chloride 101 - 111 mmol/L 101 100(L) 100(L)  CO2 22 - 32 mmol/L 26 26 23   Calcium 8.9 - 10.3 mg/dL 8.9 8.7(L) 9.0  Total Protein 6.5 - 8.1 g/dL 6.6 5.4(L) 6.4(L)  Total Bilirubin 0.3 - 1.2 mg/dL 0.9 0.6 0.7  Alkaline Phos 38 - 126 U/L 89 78 87  AST 15 - 41 U/L 29 38 43(H)  ALT 14 - 54 U/L 22 22 23    Component     Latest Ref Rng & Units 11/28/2017  CA 19-9     0 - 35 U/mL 23,669 (H)  CEA     0.0 - 4.7 ng/mL 18.6 (H)  Ferritin     11 - 307 ng/mL 348 (H)  AFP, Serum, Tumor Marker     0.0 - 8.3 ng/mL 3.7        RADIOGRAPHIC STUDIES: I have personally reviewed the radiological images as listed and agreed with the findings in the report. Dg Chest 2 View  Result Date: 11/28/2017 CLINICAL DATA:  Chest pain. EXAM: CHEST  2 VIEW COMPARISON:  No recent prior. FINDINGS: Mediastinum hilar structures  are normal. Right cardiophrenic angle density is noted. This may represent a process such as a pericardial cyst. Mild left base subsegmental atelectasis. No pleural effusion pneumothorax. Contrast in the colon. IMPRESSION: 1. Right cardiophrenic angle density is noted. This may represent a a process such as a pericardial cyst. 2. Mild left base subsegmental atelectasis. Electronically Signed   By: [place]   On: 11/28/2017 11:02   Ct Chest W Contrast  Result Date: 12/01/2017 CLINICAL DATA:  Pancreas and  liver masses EXAM: CT CHEST WITH CONTRAST TECHNIQUE: Multidetector CT imaging of the chest was performed during intravenous contrast administration. CONTRAST:  80mL ISOVUE-300 IOPAMIDOL (ISOVUE-300) INJECTION 61% COMPARISON:  Radiograph 11/28/2017, ultrasound 12/01/2017, CT 11/27/2017 FINDINGS: Cardiovascular: Nonaneurysmal aorta. Mild atherosclerotic calcifications. Coronary artery calcification. Normal heart size. No pericardial effusion. Prominent pericardial fat. Right anterior diaphragmatic hernia containing mesentery and a small amount of fluid in the hernia sac. This is felt to correspond to the radiographic opacity. Mediastinum/Nodes: Midline trachea. No thyroid mass. Borderline lymph no measuring 9 mm in the AP window. Esophagus within normal limits. Lungs/Pleura: Negative for pneumothorax or pleural effusion. No suspicious pulmonary nodules. Platelike atelectasis in the right lower lobe with small right pleural effusion. Atelectasis at the left lung base. Nonspecific foci of ground-glass density in the upper lobes. Upper Abdomen: Linear fluid and gas in the posterior right hepatic lobe presumably related to recent liver biopsy. Tiny gas anterior to the liver and trace perihepatic fluid. Re- demonstrated hypodense liver lesions. Partially visualized indistinct mass at the head of the pancreas. Small amount of fluid around the spleen. Musculoskeletal: No chest wall abnormality. No acute or  significant osseous findings. IMPRESSION: 1. Borderline enlarged mediastinal lymph node. Otherwise no definite evidence for metastatic disease to the chest. 2. Nonspecific scattered foci of ground-glass density within the bilateral upper lobes. Partial atelectasis within both lower lobes and small amount of right pleural effusion. 3. Right anterior diaphragmatic hernia containing mesentery and small amount of fluid in the hernia sac, felt to correspond to the radiographic opacity 4. Small amount of perihepatic fluid and gas likely due to recent biopsy. Linear fluid and gas containing tract in the right hepatic lobe also likely related to recent liver biopsy. Suspicious hypodense liver masses re- demonstrated as is a partially visualized ill-defined mass at the head of the pancreas 5. Small amount of fluid around the spleen Aortic Atherosclerosis (ICD10-I70.0). Electronically Signed   By: Donavan Foil M.D.   On: 12/01/2017 23:27   US Biopsy (liver)  Result Date: 12/01/2017 INDICATION: Concern for metastatic pancreatic cancer. Please from ultrasound-guided biopsy for tissue diagnostic purposes. EXAM: ULTRASOUND GUIDED LIVER LESION BIOPSY COMPARISON:  CT abdomen pelvis - 11/27/2017 MEDICATIONS: None ANESTHESIA/SEDATION: Fentanyl 50 mcg IV; Versed 1 mg IV Total Moderate Sedation time: 13 minutes. The patient's level of consciousness and vital signs were monitored continuously by radiology nursing throughout the procedure under my direct supervision. COMPLICATIONS: None immediate. PROCEDURE: Informed written consent was obtained from the patient after a discussion of the risks, benefits and alternatives to treatment. The patient understands and consents the procedure. A timeout was performed prior to the initiation of the procedure. Ultrasound scanning was performed of the right upper abdominal quadrant demonstrates 2 adjacent hypoechoic lesions within the subcapsular aspect the right lobe of the liver correlating  with the ill-defined lesions seen on preceding abdominal CT image 17, series 2). Dominant ill-defined lesion measures approximately 1.3 x 1.3 cm (image 3). This lesion was targeted for biopsy given lesion location and sonographic window. The procedure was planned. The right upper abdominal quadrant was prepped and draped in the usual sterile fashion. The overlying soft tissues were anesthetized with 1% lidocaine with epinephrine. A 17 gauge, 6.8 cm co-axial needle was advanced into a peripheral aspect of the lesion. This was followed by 4 core biopsies with an 18 gauge core device under direct ultrasound guidance. The coaxial needle tract was embolized with a small amount of Gel-Foam slurry and superficial hemostasis was obtained with manual  compression. Post procedural scanning was negative for definitive area of hemorrhage or additional complication. A dressing was placed. The patient tolerated the procedure well without immediate post procedural complication. IMPRESSION: Technically successful ultrasound guided core needle biopsy of indeterminate hypoechoic lesion within subcapsular aspect the right lobe of the liver. PLAN: If this biopsy proves nondiagnostic, would recommend further evaluation with abdominal MRI for further lesion characterization. Electronically Signed   By: Sandi Mariscal M.D.   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 Per ED report outside CT scan images and report of which are not currently available showed   multiple ill defined lesions in the liver (largest being 2.4 cm) and a mass involving the head of the pancreas measuring 4.8 x 4.3 cm.  CT chest  with borderline enlarged mediastinal lymph node but no overt metastatic disease to the chest.  Component     Latest Ref Rng & Units 11/28/2017  CA 19-9     0 - 35 U/mL 23,669 (H)   Biopsy of the liver lesion was consistent with metastatic adenocarcinoma of pancreaticobiliary origin  2) Dark stools - ?Melena. hgb WNL at this time. EGD with HH and some cameron ulcers. No overt active bleeding noted. -PPI BID 3) Hypokalemia -resolved  4) Mild Pancreatic enzyme elevation - no clinical evidence ofovert pancreatitis.  PLAN -I discussed the new diagnosis of pancreatic cancer , staging, natural history, prognosis and palliative nature of treatments. -we discussed different treatment options of palliative chemotherapy vs Best supportive cares. -patient ECOG PS is current 1-2 and she notes that she is incline to f/u in the [facility] with [place] and plan to pursue palliative chemotherapy. -will need outpatient PET/CT -would consider palliative gemcitabine + Abraxane.  5) Cancer related pain -pain per hospitalist team. Will continue to mx this as outpatient  6) Anxiety. Patient has a h/o MDD . -anticipate this is going to be a significant issue. -would benefit from outpatient f/u with behavioral health.  Will help setup outpatient f/u with [place] in the cancer clinic with in 1 week   I spent 30 minutes counseling the patient face to face. The total time spent in the appointment was 40 minutes and more than 50% was on counseling and direct patient cares.    Sullivan Lone MD [place] AAHIVMS [facility] Hematology/Oncology Physician [facility]  (Office):       [phone] (Work cell):  [phone] (Fax):           [phone]  12/02/2017 2:03 PM

## 2017-12-03 ENCOUNTER — Telehealth: Payer: Self-pay

## 2017-12-03 DIAGNOSIS — C250 Malignant neoplasm of head of pancreas: Principal | ICD-10-CM

## 2017-12-03 DIAGNOSIS — F419 Anxiety disorder, unspecified: Secondary | ICD-10-CM

## 2017-12-03 DIAGNOSIS — E440 Moderate protein-calorie malnutrition: Secondary | ICD-10-CM | POA: Diagnosis present

## 2017-12-03 DIAGNOSIS — K649 Unspecified hemorrhoids: Secondary | ICD-10-CM

## 2017-12-03 LAB — CBC
HCT: 35.2 % — ABNORMAL LOW (ref 36.0–46.0)
Hemoglobin: 11.6 g/dL — ABNORMAL LOW (ref 12.0–15.0)
MCH: 32 pg (ref 26.0–34.0)
MCHC: 33 g/dL (ref 30.0–36.0)
MCV: 97 fL (ref 78.0–100.0)
PLATELETS: 231 10*3/uL (ref 150–400)
RBC: 3.63 MIL/uL — ABNORMAL LOW (ref 3.87–5.11)
RDW: 12.3 % (ref 11.5–15.5)
WBC: 7.4 10*3/uL (ref 4.0–10.5)

## 2017-12-03 LAB — COMPREHENSIVE METABOLIC PANEL
ALT: 17 U/L (ref 14–54)
AST: 21 U/L (ref 15–41)
Albumin: 2.6 g/dL — ABNORMAL LOW (ref 3.5–5.0)
Alkaline Phosphatase: 78 U/L (ref 38–126)
Anion gap: 13 (ref 5–15)
BUN: 5 mg/dL — ABNORMAL LOW (ref 6–20)
CHLORIDE: 100 mmol/L — AB (ref 101–111)
CO2: 20 mmol/L — AB (ref 22–32)
Calcium: 8.6 mg/dL — ABNORMAL LOW (ref 8.9–10.3)
Creatinine, Ser: 0.57 mg/dL (ref 0.44–1.00)
GFR calc non Af Amer: >60 mL/min (ref >60)
Glucose, Bld: 112 mg/dL — ABNORMAL HIGH (ref 65–99)
POTASSIUM: 3.3 mmol/L — AB (ref 3.5–5.1)
SODIUM: 133 mmol/L — AB (ref 135–145)
Total Bilirubin: 0.7 mg/dL (ref 0.3–1.2)
Total Protein: 5.7 g/dL — ABNORMAL LOW (ref 6.5–8.1)

## 2017-12-03 LAB — LIPASE, BLOOD: Lipase: 48 U/L (ref 11–51)

## 2017-12-03 LAB — GLUCOSE, CAPILLARY: GLUCOSE-CAPILLARY: 125 mg/dL — AB (ref 65–99)

## 2017-12-03 MED ORDER — POTASSIUM CHLORIDE CRYS ER 20 MEQ PO TBCR
40.0000 meq | EXTENDED_RELEASE_TABLET | Freq: Once | ORAL | Status: AC
  Administered 2017-12-03: 40 meq via ORAL
  Filled 2017-12-03: qty 2

## 2017-12-03 MED ORDER — DICLOFENAC SODIUM 1 % TD GEL: 4.0000 g | Freq: Four times a day (QID) | TRANSDERMAL | 0 refills | Status: DC

## 2017-12-03 MED ORDER — OXYCODONE HCL ER 10 MG PO T12A
20.0000 mg | EXTENDED_RELEASE_TABLET | Freq: Two times a day (BID) | ORAL | Status: DC
  Administered 2017-12-03: 20 mg via ORAL
  Filled 2017-12-03: qty 2

## 2017-12-03 MED ORDER — OXYCODONE HCL 5 MG PO TABS: 5.0000 mg | ORAL_TABLET | ORAL | Status: DC | PRN

## 2017-12-03 MED ORDER — PROMETHAZINE HCL 12.5 MG PO TABS: 12.5000 mg | ORAL_TABLET | Freq: Four times a day (QID) | ORAL | 0 refills | Status: AC | PRN

## 2017-12-03 MED ORDER — OXYCODONE HCL 5 MG PO TABS: 5.0000 mg | ORAL_TABLET | ORAL | 0 refills | Status: AC | PRN

## 2017-12-03 MED ORDER — OXYCODONE HCL ER 20 MG PO T12A: 20.0000 mg | EXTENDED_RELEASE_TABLET | Freq: Two times a day (BID) | ORAL | 0 refills | Status: AC

## 2017-12-03 MED ORDER — POTASSIUM CHLORIDE CRYS ER 10 MEQ PO TBCR
EXTENDED_RELEASE_TABLET | ORAL | Status: AC
  Filled 2017-12-03: qty 1

## 2017-12-03 MED ORDER — LAMOTRIGINE 100 MG PO TABS: 100.0000 mg | ORAL_TABLET | Freq: Two times a day (BID) | ORAL | 0 refills | Status: AC

## 2017-12-03 NOTE — Telephone Encounter (Signed)
Attempt to communicate with patient to get her scheduled with Dr. Irene Limbo at 12:40pm on Friday 12/21. No answer. Scheduling message sent.

## 2017-12-03 NOTE — Progress Notes (Signed)
   Subjective:  Patient was accompanied by her daughter today. She had a discussion with Oncologist Dr. Irene Limbo yesterday, but did not want to discuss in front of her daughter. She has a follow up appointment set up.   She states that the Oxycontin helped her abdominal pain tremendously and only required 1 dose of Dilaudid IV for breakthrough pain. She is ready to be discharged. Pain medications that will be prescribed at discharge were discussed with the patient.   Objective:  Vital signs in last 24 hours: Vitals:   12/02/17 0533 12/02/17 1415 12/02/17 2210 12/03/17 0632  BP: 98/61 104/66 107/61 105/66  Pulse: (!) 108 81 87 84  Resp: 17 18 16 16   Temp: (!) 100.6 F (38.1 C) 98.9 F (37.2 C) 98.5 F (36.9 C) 98.1 F (36.7 C)  TempSrc: Oral Oral Oral Oral  SpO2: 94% 93% 92% 93%  Weight:      Height:       General: sitting in chair in NAD Pulm: normal effort Abd: soft, non distended, BS normal, diffuse tenderness to palpation worse in epigastric area and RUQ   Ext: extremities well perfused, no peripheral edema Neuro: alert and oriented X3, cranial nerves II-XII grossly intact Psych: Normal mood and affect  Assessment/Plan:  Principal Problem:   Pancreatic mass Active Problems:   Abdominal pain   Liver metastases (HCC)   Loss of weight   Melena   Abdominal mass  Pancreatic mass, multiple liver lesions  Abdominal pain, gradual weight loss Pathology report indicates metastatic pancreatobiliary adenocarcinoma. Pain well controlled on Oxycontin 20 mg Q12 hours. -Seen by oncology, will set up follow up appointment -Discharge with new pain control medicine: Oxycontin 20 mg Q12 hours, Oxycodone IR 5-10 mg every four hours for breakthrough pain  -Discharge with antinausea medications      Dispo: Anticipated discharge today.   Melanee Spry, MD 12/03/2017, 11:24 AM Pager [phone]

## 2017-12-03 NOTE — Progress Notes (Signed)
1240 Patient discharged to home. Patient verbalizes understanding of all discharge instructions including discharge medications and follow up MD visits. Patient accompanied by friend.

## 2017-12-04 DIAGNOSIS — C259 Malignant neoplasm of pancreas, unspecified: Secondary | ICD-10-CM

## 2017-12-04 NOTE — Discharge Summary (Signed)
Name: Chelsea Clements MRN: [id number] DOB: 1961/08/29 56 y.o. PCP: Remi Haggard, FNP  Date of Admission: 11/28/2017 10:42 AM Date of Discharge: 12/03/2017 Attending Physician: No att. providers found  Discharge Diagnosis: 1. Metstatic Pancreatic Cancer Principal Problem:   Pancreatic mass Active Problems:   Abdominal pain   Liver metastases (HCC)   Loss of weight   Melena   Abdominal mass   Moderate malnutrition (HCC)   Pancreatic adenocarcinoma ([place])   Discharge Medications: Allergies as of 12/03/2017   No Known Allergies     Medication List    STOP taking these medications   meloxicam 7.5 MG tablet Commonly known as:  MOBIC     TAKE these medications   acetaminophen 500 MG tablet Commonly known as:  TYLENOL Take 1,000 mg by mouth every 4 (four) hours as needed for mild pain.   amLODipine 5 MG tablet Commonly known as:  NORVASC Take 5 mg by mouth daily.   diclofenac sodium 1 % Gel Commonly known as:  VOLTAREN Apply 4 g topically 4 (four) times daily.   fenofibrate 145 MG tablet Commonly known as:  TRICOR Take 145 mg by mouth daily.   ICY HOT ARTHRITIS PAIN RELIEF EX Apply 1 application topically daily as needed (for pain).   lamoTRIgine 100 MG tablet Commonly known as:  LAMICTAL Take 100 mg by mouth 2 (two) times daily. What changed:  Another medication with the same name was changed. Make sure you understand how and when to take each.   lamoTRIgine 100 MG tablet Commonly known as:  LAMICTAL Take 1 tablet (100 mg total) by mouth 2 (two) times daily. What changed:    medication strength  how much to take  when to take this   ondansetron 4 MG disintegrating tablet Commonly known as:  ZOFRAN ODT Take 1 tablet (4 mg total) by mouth every 8 (eight) hours as needed for nausea or vomiting.   oxyCODONE 5 MG immediate release tablet Commonly known as:  Oxy IR/ROXICODONE Take 1-2 tablets (5-10 mg total) by mouth every 4 (four) hours as  needed for breakthrough pain.   oxyCODONE 20 mg 12 hr tablet Commonly known as:  OXYCONTIN Take 1 tablet (20 mg total) by mouth every 12 (twelve) hours.   promethazine 12.5 MG tablet Commonly known as:  PHENERGAN Take 1 tablet (12.5 mg total) by mouth every 6 (six) hours as needed for nausea or vomiting.   simvastatin 20 MG tablet Commonly known as:  ZOCOR Take 20 mg by mouth at bedtime.       Disposition and follow-up:   Chelsea Clements was discharged [facility] in Stable condition.  At the hospital follow up visit please address:  1.  Metastatic Pancreatic Cancer - Initially presented with abdominal pain>> Adequate pain control on current regimen of Oxycontin and Oxy IR? -Treatment plan? Symptom management? - Nutritional status - Other symptoms: Nausea, vomiting, etc? - Mood? Quality of life? - During hospitalization patient had not told her family yet, has she communicated her diagnosis with the family?  Melena -Hgb 11.6 at discharge, ~13-14 on admission, remained stable during hospitalization -EGD revealed hiatal hernia with non-bleeding cameron lesions  -On Protonix IV then transitioned to PO 40 mg BID during hospitalization, did not continue per patient request at discharge  -Consider CBC at follow up as patients Hgb was slightly down trending -Would consider restarting Protonix if patient continues to experience melena  BRBPR -No extensive work up done as patient's Hgb  was stable and history was consistent with hemorrhoids  -Consider CBC at follow up as patients Hgb was slightly down trending  Seizure Disorder -No seizure activity while hospitalized   Major Depressive Disorder, Anxiety -Mood stable during hospitalization -Able to start Pristriq and Propranolol?   2.  Labs / imaging needed at time of follow-up: -Consider CBC given down trend of Hgb (14 > 11.8)  3.  Pending labs/ test needing follow-up: None  Follow-up  Appointments:   Hospital Course by problem list: Principal Problem:   Pancreatic mass Active Problems:   Abdominal pain   Liver metastases (HCC)   Loss of weight   Melena   Abdominal mass   Moderate malnutrition (HCC)   Pancreatic adenocarcinoma ([place])   1. Metastatic Pancreatic Cancer Chelsea Clements was admitted to [facility] and the Internal Medicine Teaching service for abdominal pain and an outside abomninal CT scan that revealed a pancreatic head mass, as well as multiple ill defined live lesions. Upon initial arrival to the emergency department, the patient was hemodynamically stable with labs within normal limits. Oncology was consulted and recommended admission for expedited work up of her pancreatic and liver masses. Additional labs revealed, elevated lipase and amylase. Her appetite and ability to eat remained stable and she tolerated a regular diet. Labs also revelaed elevated CA 19-9 and CEA tumor markers.  The patient underwent ultrasound guided biopsy of the liver without complications. Surgical biopsies were adequate and revealed Pancreatobiliary adenocarcinoma. The patient also underwent a staging CT chest with contrast that did not show any definitive metastasis to the chest (see final read below).   The patient did develop two episodes of fever while hospitalized. Blood cultures and urine analysis were negative for infection. Besides her abdominal pain the patient had no other symptoms or signs of infection.   Pain was initially managed with IV morphine, which did provide adequate pain relief. She was changed to IV dilaudid. Prior to discharge, the patient was transitioned to a PO pain regimen that included OxyContin 20 mg q12 hours and Oxycodone IR 5-10 mg q4 hours. Her pain was well controlled on this regimen and she was discharged with the above medications.   She was seen by oncology who discussed her diagnosis, prognosis, and treatment options. Planned to follow up  at [facility] within one week of discharge.  2. Melena Patient had reported recent episodes of melena, so GI was consulted. She was placed on IV PPI for several days. GI performed an EGD, which revealed a 6 cm hiatal hernia associated with non-bleeding cameron lesions. Esophagus was normal. Per their recommendations she was started on PO protonix 40 mg BID. This was not resumed at discharged per patient request.   3. BRBPR One episode of BRBPR on the fourth day of hospitalization. She noted a small amount on the toilet paper after wiping. No further episodes occurred. Therefore, no extensive work up done as patient's Hgb was stable and history was consistent with hemorrhoids   4. Seizure Disorder No seizure activity while hospitalized. The patient was kept on her home dose of Lamotrigine, which was also continued at discharge.   Major Depressive Disorder Chelsea Clements's mood was stable during hospitalization. She had one very emotional morning and had 2 anxiety attacks that were relieved by PRN ativan. The patient had recently been prescribed Pristiq and Propanolol, but had not yet started these medications. These were not started while hospitalized.    Discharge Vitals:   BP 105/66 (  BP Location: Right Arm)   Pulse 84   Temp 98.1 F (36.7 C) (Oral)   Resp 16   Ht 5' 3.5" (1.613 m)   Wt 138 lb (62.6 kg)   SpO2 93%   BMI 24.06 kg/m   Pertinent Labs, Studies, and Procedures:  CBC Latest Ref Rng & Units 12/03/2017 12/02/2017 12/01/2017  WBC 4.0 - 10.5 K/uL 7.4 9.3 7.2  Hemoglobin 12.0 - 15.0 g/dL 11.6(L) 13.1 12.0  Hematocrit 36.0 - 46.0 % 35.2(L) 39.4 36.3  Platelets 150 - 400 K/uL 231 255 243   CMP Latest Ref Rng & Units 12/03/2017 12/02/2017 12/01/2017  Glucose 65 - 99 mg/dL 112(H) 134(H) 190(H)  BUN 6 - 20 mg/dL 5(L) <5(L) <5(L)  Creatinine 0.44 - 1.00 mg/dL 0.57 0.63 0.70  Sodium 135 - 145 mmol/L 133(L) 134(L) 134(L)  Potassium 3.5 - 5.1 mmol/L 3.3(L) 3.8 3.4(L)   Chloride 101 - 111 mmol/L 100(L) 101 100(L)  CO2 22 - 32 mmol/L 20(L) 26 26  Calcium 8.9 - 10.3 mg/dL 8.6(L) 8.9 8.7(L)  Total Protein 6.5 - 8.1 g/dL 5.7(L) 6.6 5.4(L)  Total Bilirubin 0.3 - 1.2 mg/dL 0.7 0.9 0.6  Alkaline Phos 38 - 126 U/L 78 89 78  AST 15 - 41 U/L 21 29 38  ALT 14 - 54 U/L 17 22 22    Component     Latest Ref Rng & Units 11/28/2017  CA 19-9     0 - 35 U/mL 23,669 (H)  CEA     0.0 - 4.7 ng/mL 18.6 (H)  Ferritin     11 - 307 ng/mL 348 (H)  AFP, Serum, Tumor Marker     0.0 - 8.3 ng/mL 3.7     Surgical PathologyINAL DIAGNOSIS Diagnosis Liver, needle/core biopsy - ADENOCARCINOMA. - SEE COMMENT. Microscopic Comment Given the clinical scenario, the features are consistent with metastatic pancreatobiliary adenocarcinoma.  CT Chest W Contrast IMPRESSION: 1. Borderline enlarged mediastinal lymph node. Otherwise no definite evidence for metastatic disease to the chest. 2. Nonspecific scattered foci of ground-glass density within the bilateral upper lobes. Partial atelectasis within both lower lobes and small amount of right pleural effusion. 3. Right anterior diaphragmatic hernia containing mesentery and small amount of fluid in the hernia sac, felt to correspond to the radiographic opacity 4. Small amount of perihepatic fluid and gas likely due to recent biopsy. Linear fluid and gas containing tract in the right hepatic lobe also likely related to recent liver biopsy. Suspicious hypodense liver masses re- demonstrated as is a partially visualized ill-defined mass at the head of the pancreas 5. Small amount of fluid around the spleen  Discharge Instructions: Discharge Instructions    Call MD for:  extreme fatigue   Complete by:  As directed    Call MD for:  persistant nausea and vomiting   Complete by:  As directed    Call MD for:  redness, tenderness, or signs of infection (pain, swelling, redness, odor or green/yellow discharge around incision site)    Complete by:  As directed    Call MD for:  severe uncontrolled pain   Complete by:  As directed    Call MD for:  temperature >100.4   Complete by:  As directed    Diet - low sodium heart healthy   Complete by:  As directed    Discharge instructions   Complete by:  As directed    Chelsea Clements,   It was a pleasure taking care of you.   I have  prescribed you pain medications: Oxycontin 20 mg every 12 hours, Oxycodone immediate release 5 mg tablets every 4 hours for breakthrough pain. You can take 1-2 tablets (up to 10 mg) every four hours depending on the severity of your pain and how you are feeling. I have also prescribed you some nausea medication called Phenegran 12.5 mg. You can take phenegran as needed every six hours for nausea or vomiting. I have also prescribed you Voltaren gel to place on your neck up to four times daily for muscle spasm or pain.   Please follow up with Dr. Irene Limbo and your primary care doctor within one week after discharge.   Increase activity slowly   Complete by:  As directed       Signed: Melanee Spry, MD 12/04/2017, 2:18 PM   Pager: [phone]

## 2017-12-06 LAB — CULTURE, BLOOD (ROUTINE X 2)
CULTURE: NO GROWTH
Culture: NO GROWTH
SPECIAL REQUESTS: ADEQUATE
SPECIAL REQUESTS: ADEQUATE

## 2017-12-10 ENCOUNTER — Telehealth: Payer: Self-pay | Admitting: Internal Medicine

## 2017-12-10 ENCOUNTER — Telehealth: Payer: Self-pay | Admitting: Hematology

## 2017-12-10 NOTE — Telephone Encounter (Signed)
Spoke with patient re 12/19/17 hosp f/u with Dr. Irene Limbo. Patient given date/time/location/phone number. Date per Dr. Irene Limbo.

## 2017-12-12 ENCOUNTER — Encounter (HOSPITAL_COMMUNITY): Payer: Self-pay | Admitting: Emergency Medicine

## 2017-12-12 ENCOUNTER — Emergency Department (HOSPITAL_COMMUNITY): Payer: Medicare Other

## 2017-12-12 ENCOUNTER — Emergency Department (HOSPITAL_COMMUNITY)
Admission: EM | Admit: 2017-12-12 | Discharge: 2017-12-12 | Disposition: A | Discharge status: Home / Self Care | Payer: Medicare Other | Attending: Emergency Medicine | Admitting: Emergency Medicine

## 2017-12-12 DIAGNOSIS — R1011 Right upper quadrant pain: Secondary | ICD-10-CM | POA: Diagnosis present

## 2017-12-12 DIAGNOSIS — C7889 Secondary malignant neoplasm of other digestive organs: Secondary | ICD-10-CM | POA: Insufficient documentation

## 2017-12-12 DIAGNOSIS — Z8505 Personal history of malignant neoplasm of liver: Secondary | ICD-10-CM | POA: Insufficient documentation

## 2017-12-12 DIAGNOSIS — Z79899 Other long term (current) drug therapy: Secondary | ICD-10-CM | POA: Insufficient documentation

## 2017-12-12 DIAGNOSIS — I10 Essential (primary) hypertension: Secondary | ICD-10-CM | POA: Diagnosis not present

## 2017-12-12 DIAGNOSIS — F17210 Nicotine dependence, cigarettes, uncomplicated: Secondary | ICD-10-CM | POA: Insufficient documentation

## 2017-12-12 DIAGNOSIS — R109 Unspecified abdominal pain: Secondary | ICD-10-CM

## 2017-12-12 LAB — COMPREHENSIVE METABOLIC PANEL
ALBUMIN: 3.2 g/dL — AB (ref 3.5–5.0)
ALK PHOS: 99 U/L (ref 38–126)
ALT: 16 U/L (ref 14–54)
ANION GAP: 15 (ref 5–15)
AST: 29 U/L (ref 15–41)
CALCIUM: 8.7 mg/dL — AB (ref 8.9–10.3)
CO2: 21 mmol/L — AB (ref 22–32)
Chloride: 98 mmol/L — ABNORMAL LOW (ref 101–111)
Creatinine, Ser: 0.65 mg/dL (ref 0.44–1.00)
GFR calc Af Amer: >60 mL/min (ref >60)
GFR calc non Af Amer: >60 mL/min (ref >60)
GLUCOSE: 100 mg/dL — AB (ref 65–99)
Potassium: 3.2 mmol/L — ABNORMAL LOW (ref 3.5–5.1)
SODIUM: 134 mmol/L — AB (ref 135–145)
Total Bilirubin: 1.3 mg/dL — ABNORMAL HIGH (ref 0.3–1.2)
Total Protein: 7.1 g/dL (ref 6.5–8.1)

## 2017-12-12 LAB — CBC
HEMATOCRIT: 39.5 % (ref 36.0–46.0)
HEMOGLOBIN: 13.3 g/dL (ref 12.0–15.0)
MCH: 32.1 pg (ref 26.0–34.0)
MCHC: 33.7 g/dL (ref 30.0–36.0)
MCV: 95.4 fL (ref 78.0–100.0)
Platelets: 438 10*3/uL — ABNORMAL HIGH (ref 150–400)
RBC: 4.14 MIL/uL (ref 3.87–5.11)
RDW: 12.9 % (ref 11.5–15.5)
WBC: 8.9 10*3/uL (ref 4.0–10.5)

## 2017-12-12 LAB — LIPASE, BLOOD: Lipase: 103 U/L — ABNORMAL HIGH (ref 11–51)

## 2017-12-12 MED ORDER — SODIUM CHLORIDE 0.9 % IV BOLUS (SEPSIS)
1000.0000 mL | Freq: Once | INTRAVENOUS | Status: AC
  Administered 2017-12-12: 1000 mL via INTRAVENOUS

## 2017-12-12 MED ORDER — HYDROCODONE-ACETAMINOPHEN 5-325 MG PO TABS: 1.0000 | ORAL_TABLET | ORAL | 0 refills | Status: DC | PRN

## 2017-12-12 MED ORDER — HYDROMORPHONE HCL 1 MG/ML IJ SOLN
1.0000 mg | Freq: Once | INTRAMUSCULAR | Status: AC
  Administered 2017-12-12: 1 mg via INTRAVENOUS
  Filled 2017-12-12: qty 1

## 2017-12-12 NOTE — ED Notes (Signed)
Son at bedside given ice water and graham crackers.

## 2017-12-12 NOTE — ED Provider Notes (Signed)
[facility] DEPT Provider Note   CSN: [id number] Arrival date & time: 12/12/17  1738     History   Chief Complaint Chief Complaint  Patient presents with  . Abdominal Pain    HPI Chelsea Clements is a 56 y.o. female.  Patient is a 56 year old female who was recently admitted for abdominal pain and found to have metastatic pancreatic cancer.  She states she has had ongoing pain.  She states is the same type of pain she has had although she is having worsening pain along her right ribs.  She states that she was in a car ride yesterday and went over some potholes which seem to worsen the pain.  There is no vomiting.  No fevers.  She is having normal bowel movements.  No urinary symptoms.  She takes oxycodone 20 mg tablets twice a day as well as oxycodone 5 mg immediate release tablets for breakthrough pain.  She feels like the 5 mg tablets are making her have hallucinations and she has stopped taking those although she still taking the long-acting OxyContin.  She has an appointment next week with the cancer center to pursue palliative care treatments.      Past Medical History:  Diagnosis Date  . Abdominal mass    CT scan of abdomen 11/27/2017, and her results came back CT showing multiple ill defined lesions in the liver, as well as a mass involving the head of the pancreas. hx/notes 11/28/2017  . Anxiety   . COPD (chronic obstructive pulmonary disease) ([place])   . Depression   . Epilepsy ([place])    febrile seizures when she was young, and this changed to grand mal seizures until 2004, and then they switched to focal epilepsy, but it has been well controlled on the current AED/notes 11/28/2017  . HLD (hyperlipidemia)   . Hypertension   . Liver cancer ([place]) dx'd 11/28/2017  . Myocardial infarction [facility])    [name] 2017; "AMI; age undetermined" (11/28/2017)  . Pancreas cancer ([place]) dx'd 11/28/2017  . Pneumonia ~ 2012X 1    Patient Active Problem List   Diagnosis Date Noted  . Pancreatic adenocarcinoma ([place])   . Moderate malnutrition ([place]) 12/03/2017  . Abdominal mass   . Melena   . Abdominal pain 11/28/2017  . Pancreatic mass   . Liver metastases ([place])   . Loss of weight     Past Surgical History:  Procedure Laterality Date  . ESOPHAGOGASTRODUODENOSCOPY (EGD) WITH PROPOFOL N/A 11/30/2017   Procedure: ESOPHAGOGASTRODUODENOSCOPY (EGD) WITH PROPOFOL;  Surgeon: Yetta Flock, MD;  Location: [place];  Service: Gastroenterology;  Laterality: N/A;  . TONSILLECTOMY      OB History    No data available       Home Medications    Prior to Admission medications   Medication Sig Start Date End Date Taking? Authorizing Provider  acetaminophen (TYLENOL) 500 MG tablet Take 1,000 mg by mouth every 4 (four) hours as needed for mild pain.   Yes [provider]  Desvenlafaxine Succinate ER 25 MG TB24 Take 25 mg by mouth daily.   Yes [provider]  diclofenac sodium (VOLTAREN) 1 % GEL Apply 4 g topically 4 (four) times daily. 12/03/17  Yes Lacroce, Hulen Shouts, MD  fenofibrate (TRICOR) 145 MG tablet Take 145 mg by mouth daily.   Yes [provider]  ibuprofen (ADVIL,MOTRIN) 200 MG tablet Take 400-800 mg by mouth every 6 (six) hours as needed for mild pain.   Yes [provider]  lamoTRIgine (LAMICTAL) 100 MG tablet Take 1 tablet (100 mg total) by mouth 2 (two) times daily. 12/03/17  Yes Lacroce, Hulen Shouts, MD  oxyCODONE (OXY IR/ROXICODONE) 5 MG immediate release tablet Take 1-2 tablets (5-10 mg total) by mouth every 4 (four) hours as needed for breakthrough pain. 12/03/17  Yes Lacroce, Hulen Shouts, MD  propranolol (INDERAL) 10 MG tablet Take 10 mg by mouth 2 (two) times daily.   Yes [provider]  HYDROcodone-acetaminophen (NORCO/VICODIN) 5-325 MG tablet Take 1 tablet by mouth every 4 (four) hours as needed. 12/12/17   Malvin Johns, MD  ondansetron (ZOFRAN ODT) 4 MG disintegrating tablet  Take 1 tablet (4 mg total) by mouth every 8 (eight) hours as needed for nausea or vomiting. Patient not taking: Reported on 11/28/2017 10/18/16   Gregor Hams, MD  oxyCODONE (OXYCONTIN) 20 mg 12 hr tablet Take 1 tablet (20 mg total) by mouth every 12 (twelve) hours. Patient not taking: Reported on 12/12/2017 12/03/17   Melanee Spry, MD  promethazine (PHENERGAN) 12.5 MG tablet Take 1 tablet (12.5 mg total) by mouth every 6 (six) hours as needed for nausea or vomiting. 12/03/17   Melanee Spry, MD    Family History No family history on file.  Social History Social History   Tobacco Use  . Smoking status: Current Every Day Smoker    Packs/day: 1.00    Years: 40.00    Pack years: 40.00    Types: Cigarettes  . Smokeless tobacco: Never Used  Substance Use Topics  . Alcohol use: Yes    Alcohol/week: 6.0 oz    Types: 10 Cans of beer per week  . Drug use: No     Allergies   Patient has no known allergies.   Review of Systems Review of Systems  Constitutional: Negative for chills, diaphoresis, fatigue and fever.  HENT: Negative for congestion, rhinorrhea and sneezing.   Eyes: Negative.   Respiratory: Negative for cough, chest tightness and shortness of breath.   Cardiovascular: Negative for chest pain and leg swelling.  Gastrointestinal: Positive for abdominal pain. Negative for blood in stool, diarrhea, nausea and vomiting.  Genitourinary: Negative for difficulty urinating, flank pain, frequency and hematuria.  Musculoskeletal: Positive for back pain. Negative for arthralgias.  Skin: Negative for rash.  Neurological: Negative for dizziness, speech difficulty, weakness, numbness and headaches.     Physical Exam Updated Vital Signs BP 123/66 (BP Location: Right Arm)   Pulse 64   Temp 98.6 F (37 C) (Oral)   Resp 18   SpO2 96%   Physical Exam  Constitutional: She is oriented to person, place, and time. She appears well-developed and well-nourished.    HENT:  Head: Normocephalic and atraumatic.  Eyes: Pupils are equal, round, and reactive to light.  Neck: Normal range of motion. Neck supple.  Cardiovascular: Normal rate, regular rhythm and normal heart sounds.  Pulmonary/Chest: Effort normal and breath sounds normal. No respiratory distress. She has no wheezes. She has no rales. She exhibits no tenderness.  Abdominal: Soft. Bowel sounds are normal. There is tenderness. There is no rebound and no guarding.  Positive tenderness in the epigastrium and across the upper abdomen.  There is also some tenderness along the right lateral and posterior lower ribs.  No crepitus or deformity is noted.  No spinal tenderness is noted.  Musculoskeletal: Normal range of motion. She exhibits no edema.  Lymphadenopathy:    She has no cervical adenopathy.  Neurological: She is alert  and oriented to person, place, and time.  Skin: Skin is warm and dry. No rash noted.  Psychiatric: She has a normal mood and affect.     ED Treatments / Results  Labs (all labs ordered are listed, but only abnormal results are displayed) Labs Reviewed  LIPASE, BLOOD - Abnormal; Notable for the following components:      Result Value   Lipase 103 (*)    All other components within normal limits  COMPREHENSIVE METABOLIC PANEL - Abnormal; Notable for the following components:   Sodium 134 (*)    Potassium 3.2 (*)    Chloride 98 (*)    CO2 21 (*)    Glucose, Bld 100 (*)    BUN <5 (*)    Calcium 8.7 (*)    Albumin 3.2 (*)    Total Bilirubin 1.3 (*)    All other components within normal limits  CBC - Abnormal; Notable for the following components:   Platelets 438 (*)    All other components within normal limits    EKG  EKG Interpretation None       Radiology Dg Ribs Unilateral W/chest Right  Result Date: 12/12/2017 CLINICAL DATA:  Right rib and right breast pain.  November 28, 2017 EXAM: RIGHT RIBS AND CHEST - 3+ VIEW COMPARISON:  None. FINDINGS: No fracture  or other bone lesions are seen involving the ribs. There is no evidence of pneumothorax or pleural effusion. Both lungs are clear. Heart size and mediastinal contours are within normal limits. Prominent right pericardial pad is identified unchanged. IMPRESSION: No acute fracture or dislocation of right ribs. Electronically Signed   By: Abelardo Diesel M.D.   On: 12/12/2017 20:06    Procedures Procedures (including critical care time)  Medications Ordered in ED Medications  HYDROmorphone (DILAUDID) injection 1 mg (not administered)  HYDROmorphone (DILAUDID) injection 1 mg (1 mg Intravenous Given 12/12/17 2028)  sodium chloride 0.9 % bolus 1,000 mL (1,000 mLs Intravenous New Bag/Given 12/12/17 2028)     Initial Impression / Assessment and Plan / ED Course  I have reviewed the triage vital signs and the nursing notes.  Pertinent labs & imaging results that were available during my care of the patient were reviewed by me and considered in my medical decision making (see chart for details).     Patient is a 56 year old female with recently diagnosed pancreatic cancer.  She has opted for palliative care.  She has an appointment next week with the oncologist.  She is using OxyContin 20 mg tablets twice daily but has stopped taking the oxycodone 5 mg tablets for breakthrough pain and she feels like it is causing her to have hallucinations.  She states that she destroyed the rest of those medications.  She states that she still has a 20 mg tablets.  Her pain has improved with Dilaudid in the emergency department.  Her chest x-ray is non-concerning without evidence of obvious bony lesions.  Her abdominal pain seems to be consistent with her prior pain and I do not feel that she needs repeat abdominal imaging at this point.  Her labs are non-concerning.  She has no vomiting.  Her vital signs are stable.  She was discharged home in good condition.  She has an appointment next week with her oncologist.  Return  precautions were given.  Final Clinical Impressions(s) / ED Diagnoses   Final diagnoses:  Flank pain    ED Discharge Orders        Ordered  HYDROcodone-acetaminophen (NORCO/VICODIN) 5-325 MG tablet  Every 4 hours PRN     12/12/17 2201       Malvin Johns, MD 12/12/17 2203

## 2017-12-12 NOTE — ED Triage Notes (Signed)
Per [facility] pt from home c/o right sided abdominal pain radiating into back. Patient recently diagnosed with stage 4 cancer. Pt recently had liver biopsy. Reports pain is consistent with previous pain. Denies N/V/D.

## 2017-12-17 NOTE — Telephone Encounter (Signed)
   Reason for call:   I received a call from Chelsea Clements at 1000 AM indicating she was concerned about medication side effects. She states 3 days prior to our phone call she began having hallucinations and vivid dreams.    Pertinent Data:   Ms. Forquer was on [facility] service for several days following diagnosis of Pancreatobilliary adenocarcinoma. She was provided Oxy IR 5-10 mg q4 hours for breakthrough pain and OxyContin 20 mg BID for moderate to severe pain. These medications were started while an inpatient. She had no adverse side effects when on these medications in the hospital.  3 days prior to her phone call she had started a new antidepressant medication called Pristiq- this medication was prescribed by her PCP  She had not followed up with Oncology yet   Assessment / Plan / Recommendations:   Discussed with the patient that her hallucinations seem to coincide with the initiation of Pristiq and she should call her PCP, as well as follow up with Oncology   As always, pt is advised that if symptoms worsen or new symptoms arise, they should go to an urgent care facility or to to ER for further evaluation.   Melanee Spry, MD   12/17/2017, 7:35 PM

## 2017-12-19 ENCOUNTER — Telehealth: Payer: Self-pay

## 2017-12-19 ENCOUNTER — Ambulatory Visit (HOSPITAL_BASED_OUTPATIENT_CLINIC_OR_DEPARTMENT_OTHER): Payer: Medicare Other | Admitting: Hematology

## 2017-12-19 ENCOUNTER — Encounter: Payer: Self-pay | Admitting: Hematology

## 2017-12-19 ENCOUNTER — Telehealth: Payer: Self-pay | Admitting: Hematology

## 2017-12-19 VITALS — BP 134/69 | HR 59 | Temp 98.2°F | Resp 18 | Ht 63.5 in | Wt 130.9 lb

## 2017-12-19 DIAGNOSIS — G893 Neoplasm related pain (acute) (chronic): Secondary | ICD-10-CM

## 2017-12-19 DIAGNOSIS — C259 Malignant neoplasm of pancreas, unspecified: Secondary | ICD-10-CM

## 2017-12-19 DIAGNOSIS — I10 Essential (primary) hypertension: Secondary | ICD-10-CM

## 2017-12-19 DIAGNOSIS — R110 Nausea: Secondary | ICD-10-CM

## 2017-12-19 DIAGNOSIS — C787 Secondary malignant neoplasm of liver and intrahepatic bile duct: Secondary | ICD-10-CM | POA: Diagnosis not present

## 2017-12-19 DIAGNOSIS — K5903 Drug induced constipation: Secondary | ICD-10-CM

## 2017-12-19 MED ORDER — SENNOSIDES-DOCUSATE SODIUM 8.6-50 MG PO TABS: 2.0000 | ORAL_TABLET | Freq: Two times a day (BID) | ORAL | 1 refills | Status: AC

## 2017-12-19 MED ORDER — HYDROMORPHONE HCL 2 MG PO TABS: 2.0000 mg | ORAL_TABLET | ORAL | 0 refills | Status: AC | PRN

## 2017-12-19 MED ORDER — ONDANSETRON HCL 8 MG PO TABS: 8.0000 mg | ORAL_TABLET | Freq: Three times a day (TID) | ORAL | 3 refills | Status: AC | PRN

## 2017-12-19 MED ORDER — FENTANYL 25 MCG/HR TD PT72: 25.0000 ug | MEDICATED_PATCH | TRANSDERMAL | 0 refills | Status: AC

## 2017-12-19 MED ORDER — POLYETHYLENE GLYCOL 3350 17 G PO PACK: 17.0000 g | PACK | Freq: Every day | ORAL | 1 refills | Status: AC

## 2017-12-19 NOTE — Telephone Encounter (Signed)
Referral made to [facility]. [phone]

## 2017-12-19 NOTE — Patient Instructions (Signed)
Thank you for choosing [facility] to provide your oncology and hematology care.  To afford each patient quality time with our providers, please arrive 30 minutes before your scheduled appointment time.  If you arrive late for your appointment, you may be asked to reschedule.  We strive to give you quality time with our providers, and arriving late affects you and other patients whose appointments are after yours.  If you are a no show for multiple scheduled visits, you may be dismissed from the clinic at the providers discretion.   Again, thank you for choosing [facility], our hope is that these requests will decrease the amount of time that you wait before being seen by our physicians.  ______________________________________________________________________ Should you have questions after your visit to the [facility], please contact our office at [phone] between the hours of 8:30 and 4:30 p.m.    Voicemails left after 4:30p.m will not be returned until the following business day.   For prescription refill requests, please have your pharmacy contact us directly.  Please also try to allow 48 hours for prescription requests.   Please contact the scheduling department for questions regarding scheduling.  For scheduling of procedures such as PET scans, CT scans, MRI, Ultrasound, etc please contact central scheduling at [phone].   Resources For Cancer Patients and Caregivers:  American Cancer Society:  800-227-2345  Can help patients locate various types of support and financial assistance Cancer Care: 1-800-813-HOPE (4673) Provides financial assistance, online support groups, medication/co-pay assistance.   [place] DSS:  336-641-3447 Where to apply for food stamps, Medicaid, and utility assistance Medicare Rights Center: 800-333-4114 Helps people with Medicare understand their rights and benefits, navigate the Medicare system, and secure the  quality healthcare they deserve SCAT: 336-333-6589 [place] [employer]'s shared-ride transportation service for eligible riders who have a disability that prevents them from riding the fixed route bus.   For additional information on assistance programs please contact our social worker:   Grier Hock/Abigail Elmore:  [phone] 

## 2017-12-19 NOTE — Telephone Encounter (Signed)
Scheduled appt per 1/4 los - Gave patient AVS and calender per los.  

## 2017-12-19 NOTE — Progress Notes (Signed)
Chelsea Clements   HEMATOLOGY/ONCOLOGY OUTPATIENT PROGRESS NOTE  Date of Service: 12/19/2017   CC: Post hospitalization f/u for newly diagnosed metastatic pancreatic cancer  HPI Chelsea Clements is a wonderful 57 y.o. female who has been referred to [place] by Dr Heber [place], Rachel Moulds, DO for evaluation and management of new findings concerning for metastatic pancreatic cancer.  Patient has a history of hypertension, depression, dyslipidemia, COPD, epilepsy and presented to her PCP on Wednesday, 11/26/2017 with generalized diffuse abdominal pain and reportedly had a CT scan of her abdomen yesterday which showed multiple ill-defined lesions in the liver as well as a mass involving the head of the pancreas.  She was sent to the emergency room today by her primary care physician.  In the emergency room she noted that her stools appear dark but she is uncertain if there was blood in it. Denies being on iron or using peptobismol or other meds that could change the color or her stools. Notes that they are green today.  She has been having decreased appetite and difficulty with eating due to pain triggered by oral intake. She notes an 18 pound weight loss from about a year ago when she is to weigh 154 pounds and is now down to 138 pounds.    Patient presented with Abdominal pain from pancreatic mass and concern for multiple liver metastases Overallpresentation is concerning for metastatic pancreatic cancer. Per ED report outside CT scan images and report of whicharenot currently available showed multiple ill defined lesions in the liver (largest being 2.4 cm) and a mass involving the head of the pancreas measuring 4.8 x 4.3 cm.  CT chest with borderline enlarged mediastinal lymph node but no overt metastatic disease to the chest.   Component     Latest Ref Rng & Units 11/28/2017  CA 19-9     0 - 35 U/mL 23,669 (H)   Biopsy of the liver lesion was consistent with metastatic adenocarcinoma of  pancreaticobiliary origin  SUBJECTIVE  Chelsea Clements is here for post hospitalization followup accompanied by her daughter and sister. She reports experiencing delusions/hallucinations which mainly happen at nighttime after falling asleep but before REM sleep. These happened before her visit to the ED. She is taking Oxycontin 20mg  po BID prn Oxycodone 3-6 pills daily. We went over all the available results her diagnosis, grave prognosis and palliative chemotherapy treatment options in details.  She went over the options with her accompanying family and very clearly made a decision to pursue Best supportive cares through hospice. She notes that she is very sure she dose not want to consider any form of palliative chemotherapy and wants to focus on comfort.  On review of systems, pt reports delusions, pain all over the body, weight loss, constipation, loss of appetite and denies fever, chills, night sweats and any other accompanying symptoms.    OBJECTIVE:  NAD  PHYSICAL EXAMINATION: . Vitals:   12/19/17 1404  BP: 134/69  Pulse: (!) 59  Resp: 18  Temp: 98.2 F (36.8 C)  TempSrc: Oral  SpO2: 100%  Weight: 130 lb 14.4 oz (59.4 kg)  Height: 5' 3.5" (1.613 m)   Filed Weights   12/19/17 1404  Weight: 130 lb 14.4 oz (59.4 kg)   .Body mass index is 22.82 kg/m.   GENERAL:alert, in no acute distress and comfortable SKIN: skin color, texture, turgor are normal, no rashes or significant lesions EYES: normal, conjunctiva are pink and non-injected, sclera clear OROPHARYNX:no exudate, no erythema and lips, buccal mucosa, and tongue  normal  NECK: supple, no JVD, thyroid normal size, non-tender, without nodularity LYMPH:  no palpable lymphadenopathy in the cervical, axillary or inguinal LUNGS: clear to auscultation with normal respiratory effort HEART: regular rate & rhythm,  no murmurs and no lower extremity edema ABDOMEN: abdomen soft, mild TTP  No Guarding/rigidity/rebound,  normoactive bowel sounds  Musculoskeletal: no cyanosis of digits and no clubbing  PSYCH: alert & oriented x 3 with fluent speech NEURO: no focal motor/sensory deficits   MEDICAL HISTORY:  Past Medical History:  Diagnosis Date  . Abdominal mass    CT scan of abdomen 11/27/2017, and her results came back CT showing multiple ill defined lesions in the liver, as well as a mass involving the head of the pancreas. hx/notes 11/28/2017  . Anxiety   . COPD (chronic obstructive pulmonary disease) ([place])   . Depression   . Epilepsy ([place])    febrile seizures when she was young, and this changed to grand mal seizures until 2004, and then they switched to focal epilepsy, but it has been well controlled on the current AED/notes 11/28/2017  . HLD (hyperlipidemia)   . Hypertension   . Liver cancer ([place]) dx'd 11/28/2017  . Myocardial infarction [facility])    [name] 2017; "AMI; age undetermined" (11/28/2017)  . Pancreas cancer ([place]) dx'd 11/28/2017  . Pneumonia ~ 2012X 1    SURGICAL HISTORY: Past Surgical History:  Procedure Laterality Date  . ESOPHAGOGASTRODUODENOSCOPY (EGD) WITH PROPOFOL N/A 11/30/2017   Procedure: ESOPHAGOGASTRODUODENOSCOPY (EGD) WITH PROPOFOL;  Surgeon: Yetta Flock, MD;  Location: [facility];  Service: Gastroenterology;  Laterality: N/A;  . TONSILLECTOMY      SOCIAL HISTORY: Social History   Socioeconomic History  . Marital status: Divorced    Spouse name: Not on file  . Number of children: Not on file  . Years of education: Not on file  . Highest education level: Not on file  Social Needs  . Financial resource strain: Not on file  . Food insecurity - worry: Not on file  . Food insecurity - inability: Not on file  . Transportation needs - medical: Not on file  . Transportation needs - non-medical: Not on file  Occupational History  . Not on file  Tobacco Use  . Smoking status: Current Every Day Smoker    Packs/day: 1.00    Years: 40.00    Pack years: 40.00      Types: Cigarettes  . Smokeless tobacco: Never Used  Substance and Sexual Activity  . Alcohol use: Yes    Alcohol/week: 6.0 oz    Types: 10 Cans of beer per week  . Drug use: No  . Sexual activity: No  Other Topics Concern  . Not on file  Social History Narrative  . Not on file    FAMILY HISTORY: No family history on file.  ALLERGIES:  has No Known Allergies.  MEDICATIONS:  Scheduled Meds:  . Current Outpatient Medications:  .  Desvenlafaxine Succinate ER 25 MG TB24, Take 25 mg by mouth daily., Disp: , Rfl:  .  diclofenac sodium (VOLTAREN) 1 % GEL, Apply 4 g topically 4 (four) times daily., Disp: 1 Tube, Rfl: 0 .  fenofibrate (TRICOR) 145 MG tablet, Take 145 mg by mouth daily., Disp: , Rfl:  .  ibuprofen (ADVIL,MOTRIN) 200 MG tablet, Take 400-800 mg by mouth every 6 (six) hours as needed for mild pain., Disp: , Rfl:  .  lamoTRIgine (LAMICTAL) 100 MG tablet, Take 1 tablet (100 mg total) by mouth 2 (  two) times daily., Disp: 60 tablet, Rfl: 0 .  ondansetron (ZOFRAN ODT) 4 MG disintegrating tablet, Take 1 tablet (4 mg total) by mouth every 8 (eight) hours as needed for nausea or vomiting., Disp: 20 tablet, Rfl: 0 .  oxyCODONE (OXY IR/ROXICODONE) 5 MG immediate release tablet, Take 1-2 tablets (5-10 mg total) by mouth every 4 (four) hours as needed for breakthrough pain., Disp: 90 tablet, Rfl: 0 .  oxyCODONE (OXYCONTIN) 20 mg 12 hr tablet, Take 1 tablet (20 mg total) by mouth every 12 (twelve) hours., Disp: 30 tablet, Rfl: 0 .  propranolol (INDERAL) 10 MG tablet, Take 10 mg by mouth 2 (two) times daily., Disp: , Rfl:  .  fentaNYL (DURAGESIC - DOSED MCG/HR) 25 MCG/HR patch, Place 1 patch (25 mcg total) onto the skin every 3 (three) days., Disp: 10 patch, Rfl: 0 .  HYDROmorphone (DILAUDID) 2 MG tablet, Take 1 tablet (2 mg total) by mouth every 4 (four) hours as needed for moderate pain or severe pain., Disp: 60 tablet, Rfl: 0 .  ondansetron (ZOFRAN) 8 MG tablet, Take 1 tablet (8 mg  total) by mouth every 8 (eight) hours as needed for nausea., Disp: 30 tablet, Rfl: 3 .  polyethylene glycol (MIRALAX) packet, Take 17 g by mouth daily., Disp: 30 each, Rfl: 1 .  promethazine (PHENERGAN) 12.5 MG tablet, Take 1 tablet (12.5 mg total) by mouth every 6 (six) hours as needed for nausea or vomiting. (Patient not taking: Reported on 12/19/2017), Disp: 30 tablet, Rfl: 0 .  senna-docusate (SENNA S) 8.6-50 MG tablet, Take 2 tablets by mouth 2 (two) times daily., Disp: 60 tablet, Rfl: 1  Continuous Infusions: PRN Meds:.  REVIEW OF SYSTEMS:    10 Point review of Systems was done is negative except as noted above.   LABORATORY DATA:  I have reviewed the data as listed  . CBC Latest Ref Rng & Units 12/12/2017 12/03/2017 12/02/2017  WBC 4.0 - 10.5 K/uL 8.9 7.4 9.3  Hemoglobin 12.0 - 15.0 g/dL 13.3 11.6(L) 13.1  Hematocrit 36.0 - 46.0 % 39.5 35.2(L) 39.4  Platelets 150 - 400 K/uL 438(H) 231 255    . CMP Latest Ref Rng & Units 12/12/2017 12/03/2017 12/02/2017  Glucose 65 - 99 mg/dL 100(H) 112(H) 134(H)  BUN 6 - 20 mg/dL <5(L) 5(L) <5(L)  Creatinine 0.44 - 1.00 mg/dL 0.65 0.57 0.63  Sodium 135 - 145 mmol/L 134(L) 133(L) 134(L)  Potassium 3.5 - 5.1 mmol/L 3.2(L) 3.3(L) 3.8  Chloride 101 - 111 mmol/L 98(L) 100(L) 101  CO2 22 - 32 mmol/L 21(L) 20(L) 26  Calcium 8.9 - 10.3 mg/dL 8.7(L) 8.6(L) 8.9  Total Protein 6.5 - 8.1 g/dL 7.1 5.7(L) 6.6  Total Bilirubin 0.3 - 1.2 mg/dL 1.3(H) 0.7 0.9  Alkaline Phos 38 - 126 U/L 99 78 89  AST 15 - 41 U/L 29 21 29   ALT 14 - 54 U/L 16 17 22    Component     Latest Ref Rng & Units 11/28/2017  CA 19-9     0 - 35 U/mL 23,669 (H)  CEA     0.0 - 4.7 ng/mL 18.6 (H)  Ferritin     11 - 307 ng/mL 348 (H)  AFP, Serum, Tumor Marker     0.0 - 8.3 ng/mL 3.7        RADIOGRAPHIC STUDIES: I have personally reviewed the radiological images as listed and agreed with the findings in the report. Dg Chest 2 View  Result Date: 11/28/2017 CLINICAL  DATA:  Chest pain. EXAM: CHEST  2 VIEW COMPARISON:  No recent prior. FINDINGS: Mediastinum hilar structures are normal. Right cardiophrenic angle density is noted. This may represent a process such as a pericardial cyst. Mild left base subsegmental atelectasis. No pleural effusion pneumothorax. Contrast in the colon. IMPRESSION: 1. Right cardiophrenic angle density is noted. This may represent a a process such as a pericardial cyst. 2. Mild left base subsegmental atelectasis. Electronically Signed   By: Marcello Moores  Register   On: 11/28/2017 11:02   Dg Ribs Unilateral W/chest Right  Result Date: 12/12/2017 CLINICAL DATA:  Right rib and right breast pain.  November 28, 2017 EXAM: RIGHT RIBS AND CHEST - 3+ VIEW COMPARISON:  None. FINDINGS: No fracture or other bone lesions are seen involving the ribs. There is no evidence of pneumothorax or pleural effusion. Both lungs are clear. Heart size and mediastinal contours are within normal limits. Prominent right pericardial pad is identified unchanged. IMPRESSION: No acute fracture or dislocation of right ribs. Electronically Signed   By: Abelardo Diesel M.D.   On: 12/12/2017 20:06   Ct Chest W Contrast  Result Date: 12/01/2017 CLINICAL DATA:  Pancreas and liver masses EXAM: CT CHEST WITH CONTRAST TECHNIQUE: Multidetector CT imaging of the chest was performed during intravenous contrast administration. CONTRAST:  12mL ISOVUE-300 IOPAMIDOL (ISOVUE-300) INJECTION 61% COMPARISON:  Radiograph 11/28/2017, ultrasound 12/01/2017, CT 11/27/2017 FINDINGS: Cardiovascular: Nonaneurysmal aorta. Mild atherosclerotic calcifications. Coronary artery calcification. Normal heart size. No pericardial effusion. Prominent pericardial fat. Right anterior diaphragmatic hernia containing mesentery and a small amount of fluid in the hernia sac. This is felt to correspond to the radiographic opacity. Mediastinum/Nodes: Midline trachea. No thyroid mass. Borderline lymph no measuring 9 mm in the  AP window. Esophagus within normal limits. Lungs/Pleura: Negative for pneumothorax or pleural effusion. No suspicious pulmonary nodules. Platelike atelectasis in the right lower lobe with small right pleural effusion. Atelectasis at the left lung base. Nonspecific foci of ground-glass density in the upper lobes. Upper Abdomen: Linear fluid and gas in the posterior right hepatic lobe presumably related to recent liver biopsy. Tiny gas anterior to the liver and trace perihepatic fluid. Re- demonstrated hypodense liver lesions. Partially visualized indistinct mass at the head of the pancreas. Small amount of fluid around the spleen. Musculoskeletal: No chest wall abnormality. No acute or significant osseous findings. IMPRESSION: 1. Borderline enlarged mediastinal lymph node. Otherwise no definite evidence for metastatic disease to the chest. 2. Nonspecific scattered foci of ground-glass density within the bilateral upper lobes. Partial atelectasis within both lower lobes and small amount of right pleural effusion. 3. Right anterior diaphragmatic hernia containing mesentery and small amount of fluid in the hernia sac, felt to correspond to the radiographic opacity 4. Small amount of perihepatic fluid and gas likely due to recent biopsy. Linear fluid and gas containing tract in the right hepatic lobe also likely related to recent liver biopsy. Suspicious hypodense liver masses re- demonstrated as is a partially visualized ill-defined mass at the head of the pancreas 5. Small amount of fluid around the spleen Aortic Atherosclerosis (ICD10-I70.0). Electronically Signed   By: Donavan Foil M.D.   On: 12/01/2017 23:27   US Biopsy (liver)  Result Date: 12/01/2017 INDICATION: Concern for metastatic pancreatic cancer. Please from ultrasound-guided biopsy for tissue diagnostic purposes. EXAM: ULTRASOUND GUIDED LIVER LESION BIOPSY COMPARISON:  CT abdomen pelvis - 11/27/2017 MEDICATIONS: None ANESTHESIA/SEDATION: Fentanyl 50  mcg IV; Versed 1 mg IV Total Moderate Sedation time: 13 minutes. The patient's level of consciousness and vital  signs were monitored continuously by radiology nursing throughout the procedure under my direct supervision. COMPLICATIONS: None immediate. PROCEDURE: Informed written consent was obtained from the patient after a discussion of the risks, benefits and alternatives to treatment. The patient understands and consents the procedure. A timeout was performed prior to the initiation of the procedure. Ultrasound scanning was performed of the right upper abdominal quadrant demonstrates 2 adjacent hypoechoic lesions within the subcapsular aspect the right lobe of the liver correlating with the ill-defined lesions seen on preceding abdominal CT image 17, series 2). Dominant ill-defined lesion measures approximately 1.3 x 1.3 cm (image 3). This lesion was targeted for biopsy given lesion location and sonographic window. The procedure was planned. The right upper abdominal quadrant was prepped and draped in the usual sterile fashion. The overlying soft tissues were anesthetized with 1% lidocaine with epinephrine. A 17 gauge, 6.8 cm co-axial needle was advanced into a peripheral aspect of the lesion. This was followed by 4 core biopsies with an 18 gauge core device under direct ultrasound guidance. The coaxial needle tract was embolized with a small amount of Gel-Foam slurry and superficial hemostasis was obtained with manual compression. Post procedural scanning was negative for definitive area of hemorrhage or additional complication. A dressing was placed. The patient tolerated the procedure well without immediate post procedural complication. IMPRESSION: Technically successful ultrasound guided core needle biopsy of indeterminate hypoechoic lesion within subcapsular aspect the right lobe of the liver. PLAN: If this biopsy proves nondiagnostic, would recommend further evaluation with abdominal MRI for further lesion  characterization. Electronically Signed   By: Sandi Mariscal M.D.   On: 12/01/2017 18:13    ASSESSMENT & PLAN:   57 year old female with   1)Newly diagnosed metastatic Pancreatic cancer with multiple metastases to the liver 2) liver metastases from pancreatic cancer   Patient presented with Abdominal pain from pancreatic mass and concern for multiple liver metastases Overall presentation is concerning for metastatic pancreatic cancer. Per ED report outside CT scan images and report of which are not currently available showed   multiple ill defined lesions in the liver (largest being 2.4 cm) and a mass involving the head of the pancreas measuring 4.8 x 4.3 cm.  CT chest with borderline enlarged mediastinal lymph node but no overt metastatic disease to the chest.  Component     Latest Ref Rng & Units 11/28/2017  CA 19-9     0 - 35 U/mL 23,669 (H)   Biopsy of the liver lesion was consistent with metastatic adenocarcinoma of pancreaticobiliary origin  2) Dark stools - ?Melena. hgb WNL at this time. EGD with HH and some cameron ulcers. No overt active bleeding noted. -PPI BID 3) Hypokalemia -K 3.2  4) Mild Pancreatic enzyme elevation - no clinical evidence of overt pancreatitis.  PLAN -We went over all the available results her diagnosis, grave prognosis and palliative chemotherapy treatment options in details. -She went over the options with her accompanying family and very clearly made a decision to pursue Best supportive cares through hospice. -She notes that she is very sure she dose not want to consider any form of palliative chemotherapy and wants to focus on comfort. -increase po intake of potassium rich foods. - referral given to [place] hospice  5) Cancer related pain -was on Oxycontin BID and prn oxycodone -- inadequate pain control and some hallucinations. -she was switched to Fentanyl patch 20mcg/h and prn dilaudid. --referral to IR for consideration of coeliac  plexus block --Prescription for colace and senna  to aid with constipation   IR referral for consideration of coeliac plexus block RTC with Dr Irene Limbo in 2 weeks  [facility] hospice referral   I spent 30 minutes counseling the patient face to face. The total time spent in the appointment was 40 minutes and more than 50% was on counseling and direct patient cares.    Sullivan Lone MD [place] AAHIVMS [facility] Hematology/Oncology [facility]  (Office):       [phone] (Work cell):  [phone] (Fax):           [phone]  12/19/2017 2:02 PM  This document serves as a record of services personally performed by Sullivan Lone, MD. It was created on his behalf by Alean Rinne, a trained medical scribe. The creation of this record is based on the scribe's personal observations and the provider's statements to them.   .I have reviewed the above documentation for accuracy and completeness, and I agree with the above. Brunetta Genera MD

## 2017-12-24 ENCOUNTER — Telehealth: Payer: Self-pay | Admitting: *Deleted

## 2017-12-24 ENCOUNTER — Other Ambulatory Visit: Payer: Self-pay | Admitting: *Deleted

## 2017-12-24 DIAGNOSIS — C259 Malignant neoplasm of pancreas, unspecified: Secondary | ICD-10-CM

## 2017-12-24 NOTE — Telephone Encounter (Signed)
Received message from pt stating rx for fentanyl and dilaudid started 1/4, still experiencing 10/10 abdominal pain.  Pt tearful in message, asking if there is anything else to help with pain.  Message left for Dr. Irene Limbo.

## 2017-12-24 NOTE — Telephone Encounter (Signed)
Per Dr. Irene Limbo, pt should present to ED if experiencing 10/10 pain on new rx for fentanyl and dilaudid.  Pt advised to present to ED for better pain management and evaluation.  Pt stated she has apt set up with hospice for later in the week.  Pt stated she did not want to go to ED bc they implied she was drug seeking at last visit.  Pt will call hospice to see if they could move up appointment.  Pt will return call with any concerns.

## 2018-01-01 ENCOUNTER — Other Ambulatory Visit: Payer: Medicare Other

## 2018-01-05 ENCOUNTER — Ambulatory Visit: Payer: Medicare Other | Admitting: Hematology

## 2018-01-07 ENCOUNTER — Inpatient Hospital Stay (HOSPITAL_COMMUNITY)
Admission: EM | Admit: 2018-01-07 | Discharge: 2018-01-09 | DRG: 092 | Disposition: A | Discharge status: Hospice | Attending: Internal Medicine | Admitting: Internal Medicine

## 2018-01-07 ENCOUNTER — Inpatient Hospital Stay (HOSPITAL_COMMUNITY)

## 2018-01-07 DIAGNOSIS — F39 Unspecified mood [affective] disorder: Secondary | ICD-10-CM | POA: Diagnosis present

## 2018-01-07 DIAGNOSIS — C259 Malignant neoplasm of pancreas, unspecified: Secondary | ICD-10-CM | POA: Diagnosis present

## 2018-01-07 DIAGNOSIS — G92 Toxic encephalopathy: Principal | ICD-10-CM | POA: Diagnosis present

## 2018-01-07 DIAGNOSIS — Z66 Do not resuscitate: Secondary | ICD-10-CM | POA: Diagnosis present

## 2018-01-07 DIAGNOSIS — Z79899 Other long term (current) drug therapy: Secondary | ICD-10-CM

## 2018-01-07 DIAGNOSIS — I10 Essential (primary) hypertension: Secondary | ICD-10-CM | POA: Diagnosis present

## 2018-01-07 DIAGNOSIS — E871 Hypo-osmolality and hyponatremia: Secondary | ICD-10-CM | POA: Diagnosis present

## 2018-01-07 DIAGNOSIS — T402X5A Adverse effect of other opioids, initial encounter: Secondary | ICD-10-CM | POA: Diagnosis present

## 2018-01-07 DIAGNOSIS — Z6821 Body mass index (BMI) 21.0-21.9, adult: Secondary | ICD-10-CM

## 2018-01-07 DIAGNOSIS — J449 Chronic obstructive pulmonary disease, unspecified: Secondary | ICD-10-CM | POA: Diagnosis present

## 2018-01-07 DIAGNOSIS — E440 Moderate protein-calorie malnutrition: Secondary | ICD-10-CM | POA: Diagnosis present

## 2018-01-07 DIAGNOSIS — F17210 Nicotine dependence, cigarettes, uncomplicated: Secondary | ICD-10-CM | POA: Diagnosis present

## 2018-01-07 DIAGNOSIS — E876 Hypokalemia: Secondary | ICD-10-CM | POA: Diagnosis present

## 2018-01-07 DIAGNOSIS — D72829 Elevated white blood cell count, unspecified: Secondary | ICD-10-CM

## 2018-01-07 DIAGNOSIS — E860 Dehydration: Secondary | ICD-10-CM | POA: Diagnosis present

## 2018-01-07 DIAGNOSIS — G9340 Encephalopathy, unspecified: Secondary | ICD-10-CM | POA: Diagnosis present

## 2018-01-07 DIAGNOSIS — C250 Malignant neoplasm of head of pancreas: Secondary | ICD-10-CM | POA: Diagnosis present

## 2018-01-07 DIAGNOSIS — R4182 Altered mental status, unspecified: Secondary | ICD-10-CM | POA: Diagnosis present

## 2018-01-07 DIAGNOSIS — I252 Old myocardial infarction: Secondary | ICD-10-CM | POA: Diagnosis not present

## 2018-01-07 DIAGNOSIS — E785 Hyperlipidemia, unspecified: Secondary | ICD-10-CM | POA: Diagnosis present

## 2018-01-07 DIAGNOSIS — G9341 Metabolic encephalopathy: Secondary | ICD-10-CM | POA: Diagnosis present

## 2018-01-07 DIAGNOSIS — Z79891 Long term (current) use of opiate analgesic: Secondary | ICD-10-CM | POA: Diagnosis not present

## 2018-01-07 DIAGNOSIS — R410 Disorientation, unspecified: Secondary | ICD-10-CM

## 2018-01-07 DIAGNOSIS — G40409 Other generalized epilepsy and epileptic syndromes, not intractable, without status epilepticus: Secondary | ICD-10-CM | POA: Diagnosis present

## 2018-01-07 DIAGNOSIS — C787 Secondary malignant neoplasm of liver and intrahepatic bile duct: Secondary | ICD-10-CM | POA: Diagnosis present

## 2018-01-07 LAB — HEPATIC FUNCTION PANEL
ALK PHOS: 151 U/L — AB (ref 38–126)
ALT: 17 U/L (ref 14–54)
AST: 37 U/L (ref 15–41)
Albumin: 2.4 g/dL — ABNORMAL LOW (ref 3.5–5.0)
BILIRUBIN DIRECT: 1.2 mg/dL — AB (ref 0.1–0.5)
Indirect Bilirubin: 1.6 mg/dL — ABNORMAL HIGH (ref 0.3–0.9)
TOTAL PROTEIN: 6 g/dL — AB (ref 6.5–8.1)
Total Bilirubin: 2.8 mg/dL — ABNORMAL HIGH (ref 0.3–1.2)

## 2018-01-07 LAB — CBC WITH DIFFERENTIAL/PLATELET
BASOS ABS: 0.1 10*3/uL (ref 0.0–0.1)
Basophils Relative: 0 %
Eosinophils Absolute: 0.1 10*3/uL (ref 0.0–0.7)
Eosinophils Relative: 1 %
HCT: 35.9 % — ABNORMAL LOW (ref 36.0–46.0)
HEMOGLOBIN: 12.3 g/dL (ref 12.0–15.0)
LYMPHS ABS: 2 10*3/uL (ref 0.7–4.0)
LYMPHS PCT: 17 %
MCH: 32.1 pg (ref 26.0–34.0)
MCHC: 34.3 g/dL (ref 30.0–36.0)
MCV: 93.7 fL (ref 78.0–100.0)
Monocytes Absolute: 2 10*3/uL — ABNORMAL HIGH (ref 0.1–1.0)
Monocytes Relative: 17 %
NEUTROS ABS: 7.8 10*3/uL — AB (ref 1.7–7.7)
NEUTROS PCT: 65 %
Platelets: 186 10*3/uL (ref 150–400)
RBC: 3.83 MIL/uL — AB (ref 3.87–5.11)
RDW: 14.1 % (ref 11.5–15.5)
WBC: 12.1 10*3/uL — AB (ref 4.0–10.5)

## 2018-01-07 LAB — BASIC METABOLIC PANEL
ANION GAP: 14 (ref 5–15)
BUN: 22 mg/dL — AB (ref 6–20)
CHLORIDE: 91 mmol/L — AB (ref 101–111)
CO2: 29 mmol/L (ref 22–32)
Calcium: 8.6 mg/dL — ABNORMAL LOW (ref 8.9–10.3)
Creatinine, Ser: 0.97 mg/dL (ref 0.44–1.00)
GFR calc Af Amer: >60 mL/min (ref >60)
GFR calc non Af Amer: >60 mL/min (ref >60)
GLUCOSE: 110 mg/dL — AB (ref 65–99)
POTASSIUM: 2.8 mmol/L — AB (ref 3.5–5.1)
Sodium: 134 mmol/L — ABNORMAL LOW (ref 135–145)

## 2018-01-07 LAB — AMMONIA: Ammonia: 41 umol/L — ABNORMAL HIGH (ref 9–35)

## 2018-01-07 MED ORDER — OXYCODONE-ACETAMINOPHEN 5-325 MG PO TABS
1.0000 | ORAL_TABLET | Freq: Once | ORAL | Status: AC
  Administered 2018-01-07: 1 via ORAL
  Filled 2018-01-07: qty 1

## 2018-01-07 MED ORDER — SODIUM CHLORIDE 0.9 % IV BOLUS (SEPSIS)
1000.0000 mL | Freq: Once | INTRAVENOUS | Status: AC
  Administered 2018-01-07: 1000 mL via INTRAVENOUS

## 2018-01-07 MED ORDER — SODIUM CHLORIDE 0.9 % IV SOLN
INTRAVENOUS | Status: DC
  Administered 2018-01-07 – 2018-01-09 (×3): via INTRAVENOUS

## 2018-01-07 MED ORDER — POTASSIUM CHLORIDE CRYS ER 20 MEQ PO TBCR
40.0000 meq | EXTENDED_RELEASE_TABLET | Freq: Once | ORAL | Status: AC
  Administered 2018-01-07: 40 meq via ORAL
  Filled 2018-01-07: qty 2

## 2018-01-07 MED ORDER — DICLOFENAC SODIUM 1 % TD GEL
4.0000 g | Freq: Three times a day (TID) | TRANSDERMAL | Status: DC
  Administered 2018-01-08 – 2018-01-09 (×6): 4 g via TOPICAL
  Filled 2018-01-07: qty 100

## 2018-01-07 MED ORDER — SODIUM CHLORIDE 0.9 % IV BOLUS (SEPSIS)
1000.0000 mL | Freq: Once | INTRAVENOUS | Status: AC
Start: 2018-01-07 — End: 2018-01-07
  Administered 2018-01-07: 1000 mL via INTRAVENOUS

## 2018-01-07 MED ORDER — POLYETHYLENE GLYCOL 3350 17 G PO PACK
17.0000 g | PACK | Freq: Every day | ORAL | Status: DC
  Administered 2018-01-08: 17 g via ORAL
  Filled 2018-01-07 (×2): qty 1

## 2018-01-07 MED ORDER — ONDANSETRON HCL 4 MG PO TABS: 4.0000 mg | ORAL_TABLET | Freq: Four times a day (QID) | ORAL | Status: DC | PRN

## 2018-01-07 MED ORDER — OXYCODONE HCL ER 10 MG PO T12A
20.0000 mg | EXTENDED_RELEASE_TABLET | Freq: Two times a day (BID) | ORAL | Status: DC
  Administered 2018-01-08 – 2018-01-09 (×4): 20 mg via ORAL
  Filled 2018-01-07 (×4): qty 2

## 2018-01-07 MED ORDER — PROPRANOLOL HCL 10 MG PO TABS
10.0000 mg | ORAL_TABLET | Freq: Two times a day (BID) | ORAL | Status: DC
  Administered 2018-01-08 – 2018-01-09 (×3): 10 mg via ORAL
  Filled 2018-01-07 (×4): qty 1

## 2018-01-07 MED ORDER — SENNOSIDES-DOCUSATE SODIUM 8.6-50 MG PO TABS
2.0000 | ORAL_TABLET | Freq: Two times a day (BID) | ORAL | Status: DC
  Administered 2018-01-08 (×2): 2 via ORAL
  Filled 2018-01-07 (×3): qty 2

## 2018-01-07 MED ORDER — ONDANSETRON HCL 4 MG/2ML IJ SOLN: 4.0000 mg | Freq: Four times a day (QID) | INTRAMUSCULAR | Status: DC | PRN

## 2018-01-07 MED ORDER — ONDANSETRON 4 MG PO TBDP
4.0000 mg | ORAL_TABLET | Freq: Once | ORAL | Status: AC
Start: 2018-01-07 — End: 2018-01-07
  Administered 2018-01-07: 4 mg via ORAL
  Filled 2018-01-07: qty 1

## 2018-01-07 MED ORDER — POTASSIUM CHLORIDE 10 MEQ/100ML IV SOLN
10.0000 meq | INTRAVENOUS | Status: AC
  Administered 2018-01-07 – 2018-01-08 (×3): 10 meq via INTRAVENOUS
  Filled 2018-01-07 (×3): qty 100

## 2018-01-07 MED ORDER — ACETAMINOPHEN 325 MG PO TABS: 650.0000 mg | ORAL_TABLET | Freq: Four times a day (QID) | ORAL | Status: DC | PRN

## 2018-01-07 MED ORDER — VENLAFAXINE HCL ER 37.5 MG PO CP24
37.5000 mg | ORAL_CAPSULE | Freq: Every day | ORAL | Status: DC
  Administered 2018-01-08 – 2018-01-09 (×2): 37.5 mg via ORAL
  Filled 2018-01-07 (×2): qty 1

## 2018-01-07 MED ORDER — MORPHINE SULFATE (PF) 4 MG/ML IV SOLN
2.0000 mg | INTRAVENOUS | Status: DC | PRN
  Administered 2018-01-07: 2 mg via INTRAVENOUS
  Filled 2018-01-07: qty 1

## 2018-01-07 MED ORDER — LAMOTRIGINE 100 MG PO TABS
100.0000 mg | ORAL_TABLET | Freq: Two times a day (BID) | ORAL | Status: DC
  Administered 2018-01-08 – 2018-01-09 (×4): 100 mg via ORAL
  Filled 2018-01-07 (×4): qty 1

## 2018-01-07 MED ORDER — ACETAMINOPHEN 650 MG RE SUPP: 650.0000 mg | Freq: Four times a day (QID) | RECTAL | Status: DC | PRN

## 2018-01-07 NOTE — Progress Notes (Addendum)
CSW called [place] at [phone] and spoke with Judson Roch. Sarah informed CSW that she will send the information to [place] with [employer]. Anderson Malta from [employer] will give CSW a call to discuss a bed at [facility].  5:46 PM CSW received phone call back from [place]. Anderson Malta explained that there is no bed available at [facility] as of right now. Chelsea stated that social worker will be back tomorrow at 8 am and CSW or pt/ family can call at that point to discuss placement. CSW explained that ED is still working her up, but at this point seems as if pt may not be admitted. CSW will continue to follow pt to address discharge needs.   Wendelyn Breslow, [facility]  [phone]

## 2018-01-07 NOTE — Progress Notes (Signed)
[facility] Liaison:  RN visit  Met with patient at bedside in the ED.  Patient is lethargic and disoriented.  Patient oriented to name and president only.  Patient could not hold a conversation.  Patient weak and dry.  Patient pale in appearance.  I did talk with Janett Billow, [facility] RN, and she advised that patient is in her home mostly alone and too weak to get up or take medications as prescribed.  She had talked with Valentina Gu Director of Patient Care and Dr. Karie Georges, [facility] MD for guidance.  They advised to have EMS pick up patient and transport to the ED.   I did speak with Beth, [facility] SW, who advised that the family had offered to have someone in the home with patient to help ensure she is taking medications as prescribed and making sure of safety, but that the patient had previously refused.    Per Benjamine Mola, ED RN, she advised patient now with hallucinations.  She did contact [facility] about whether patient was appropriate for possible symptom management.  At this time, patient will be worked up in the ED and possible admission.   If you have any hospice related questions, please feel free to call.  Thank you.  Edyth Gunnels, RN, BSN [facility] Liaison [phone]  All hospital liaisons are now on [place].

## 2018-01-07 NOTE — ED Provider Notes (Signed)
5W PROGRESSIVE CARE Provider Note  CSN: [id number] Arrival date & time: 01/07/18 1425  Chief Complaint(s) Altered Mental Status  HPI Chelsea Clements is a 57 y.o. female  Triage Note 1434 Pt mets cancer diagnosed 2 weeks ago. Home with hospice of [place]. Hasnt eaten much since. Nurse states she was on methadone and that was discontinued yesterday bc there was concern she may be methadone toxic. She was hallucinating. She was talking to people in ambulance that were not there. EMS started IV in Left AC and fluids. She is confused and anxious.  RN called [facility] to find out more information. Patient does not have consistent caregiver in home. No one lives here in [place]. Husband works in [place] and leaves her home 15 hours a day. May need to pursue [facility] for placement and symptom management.   Ollen Gross is [facility] Social worker [phone]. Donnie Aho [phone] is [facility] nurse.  RN just called [employer]- no availability.    HPI   Remainder of history, ROS, and physical exam limited due to patient's condition (AMS). Additional information was obtained from family and [facility].   Level V Caveat.    Past Medical History Past Medical History:  Diagnosis Date  . Abdominal mass    CT scan of abdomen 11/27/2017, and her results came back CT showing multiple ill defined lesions in the liver, as well as a mass involving the head of the pancreas. hx/notes 11/28/2017  . Anxiety   . COPD (chronic obstructive pulmonary disease) ([place])   . Depression   . Epilepsy ([place])    febrile seizures when she was young, and this changed to grand mal seizures until 2004, and then they switched to focal epilepsy, but it has been well controlled on the current AED/notes 11/28/2017  . HLD (hyperlipidemia)   . Hypertension   . Liver cancer ([place]) dx'd 11/28/2017  . Myocardial infarction [facility])    [name] 2017; "AMI; age undetermined" (11/28/2017)  . Pancreas cancer ([place]) dx'd 11/28/2017  .  Pneumonia ~ 2012X 1   Patient Active Problem List   Diagnosis Date Noted  . Acute metabolic encephalopathy 26/83/4196  . COPD (chronic obstructive pulmonary disease) ([place]) 01/07/2018  . Hypokalemia 01/07/2018  . Hyponatremia 01/07/2018  . Encephalopathy acute 01/07/2018  . Pancreatic adenocarcinoma ([place])   . Moderate malnutrition ([place]) 12/03/2017  . Abdominal mass   . Melena   . Abdominal pain 11/28/2017  . Pancreatic mass   . Liver metastases ([place])   . Loss of weight    Home Medication(s) Prior to Admission medications   Medication Sig Start Date End Date Taking? Authorizing Provider  Desvenlafaxine Succinate ER 25 MG TB24 Take 25 mg by mouth daily.    [provider]  diclofenac sodium (VOLTAREN) 1 % GEL Apply 4 g topically 4 (four) times daily. 12/03/17   Melanee Spry, MD  fenofibrate (TRICOR) 145 MG tablet Take 145 mg by mouth daily.    [provider]  fentaNYL (DURAGESIC - DOSED MCG/HR) 25 MCG/HR patch Place 1 patch (25 mcg total) onto the skin every 3 (three) days. 12/19/17   Brunetta Genera, MD  HYDROmorphone (DILAUDID) 2 MG tablet Take 1 tablet (2 mg total) by mouth every 4 (four) hours as needed for moderate pain or severe pain. 12/19/17   Brunetta Genera, MD  ibuprofen (ADVIL,MOTRIN) 200 MG tablet Take 400-800 mg by mouth every 6 (six) hours as needed for mild pain.    [provider]  lamoTRIgine (LAMICTAL) 100 MG tablet Take 1 tablet (100 mg total) by mouth 2 (two) times daily. 12/03/17   Lacroce, Hulen Shouts, MD  ondansetron (ZOFRAN ODT) 4 MG disintegrating tablet Take 1 tablet (4 mg total) by mouth every 8 (eight) hours as needed for nausea or vomiting. 10/18/16   Gregor Hams, MD  ondansetron (ZOFRAN) 8 MG tablet Take 1 tablet (8 mg total) by mouth every 8 (eight) hours as needed for nausea. 12/19/17   Brunetta Genera, MD  oxyCODONE (OXY IR/ROXICODONE) 5 MG immediate release tablet Take 1-2 tablets (5-10 mg total) by mouth  every 4 (four) hours as needed for breakthrough pain. 12/03/17   Melanee Spry, MD  oxyCODONE (OXYCONTIN) 20 mg 12 hr tablet Take 1 tablet (20 mg total) by mouth every 12 (twelve) hours. 12/03/17   Melanee Spry, MD  polyethylene glycol [facility]) packet Take 17 g by mouth daily. 12/19/17   Brunetta Genera, MD  promethazine (PHENERGAN) 12.5 MG tablet Take 1 tablet (12.5 mg total) by mouth every 6 (six) hours as needed for nausea or vomiting. Patient not taking: Reported on 12/19/2017 12/03/17   Melanee Spry, MD  propranolol (INDERAL) 10 MG tablet Take 10 mg by mouth 2 (two) times daily.    [provider]  senna-docusate (SENNA S) 8.6-50 MG tablet Take 2 tablets by mouth 2 (two) times daily. 12/19/17   Brunetta Genera, MD                                                                                                                                    Past Surgical History Past Surgical History:  Procedure Laterality Date  . ESOPHAGOGASTRODUODENOSCOPY (EGD) WITH PROPOFOL N/A 11/30/2017   Procedure: ESOPHAGOGASTRODUODENOSCOPY (EGD) WITH PROPOFOL;  Surgeon: Yetta Flock, MD;  Location: [place];  Service: Gastroenterology;  Laterality: N/A;  . TONSILLECTOMY     Family History No family history on file.  Social History Social History   Tobacco Use  . Smoking status: Current Every Day Smoker    Packs/day: 1.00    Years: 40.00    Pack years: 40.00    Types: Cigarettes  . Smokeless tobacco: Never Used  Substance Use Topics  . Alcohol use: Yes    Alcohol/week: 6.0 oz    Types: 10 Cans of beer per week  . Drug use: No   Allergies Patient has no known allergies.  Review of Systems Review of Systems  Unable to perform ROS: Mental status change    Physical Exam Vital Signs  I have reviewed the triage vital signs BP 124/62 (BP Location: Right Arm)   Pulse 76   Temp 98.5 F (36.9 C) (Oral)   Resp 18   Ht 5\' 3"  (1.6 m)   Wt 56 kg (123 lb  7.3 oz)   SpO2 93%   BMI 21.87 kg/m   Physical Exam  Constitutional: She appears  well-developed and well-nourished. No distress.  HENT:  Head: Normocephalic and atraumatic.  Nose: Nose normal.  Mouth/Throat: Mucous membranes are dry.  Eyes: Conjunctivae and EOM are normal. Pupils are equal, round, and reactive to light. Right eye exhibits no discharge. Left eye exhibits no discharge. No scleral icterus.  Neck: Normal range of motion. Neck supple.  Cardiovascular: Normal rate and regular rhythm. Exam reveals no gallop and no friction rub.  No murmur heard. Pulmonary/Chest: Effort normal and breath sounds normal. No stridor. No respiratory distress. She has no rales.  Abdominal: Soft. She exhibits no distension. There is no tenderness.  Musculoskeletal: She exhibits no edema or tenderness.  Neurological: She is alert. She is disoriented (oriented to person and place only).  Moves all extremities. Able to follow some commands intermittently.  Skin: Skin is warm and dry. No rash noted. She is not diaphoretic. No erythema.  Psychiatric: She has a normal mood and affect.  Vitals reviewed.   ED Results and Treatments Labs (all labs ordered are listed, but only abnormal results are displayed) Labs Reviewed  CBC WITH DIFFERENTIAL/PLATELET - Abnormal; Notable for the following components:      Result Value   WBC 12.1 (*)    RBC 3.83 (*)    HCT 35.9 (*)    Neutro Abs 7.8 (*)    Monocytes Absolute 2.0 (*)    All other components within normal limits  BASIC METABOLIC PANEL - Abnormal; Notable for the following components:   Sodium 134 (*)    Potassium 2.8 (*)    Chloride 91 (*)    Glucose, Bld 110 (*)    BUN 22 (*)    Calcium 8.6 (*)    All other components within normal limits  HEPATIC FUNCTION PANEL - Abnormal; Notable for the following components:   Total Protein 6.0 (*)    Albumin 2.4 (*)    Alkaline Phosphatase 151 (*)    Total Bilirubin 2.8 (*)    Bilirubin, Direct 1.2 (*)     Indirect Bilirubin 1.6 (*)    All other components within normal limits  AMMONIA - Abnormal; Notable for the following components:   Ammonia 41 (*)    All other components within normal limits  URINALYSIS, ROUTINE W REFLEX MICROSCOPIC  TSH  CBC  BASIC METABOLIC PANEL                                                                                                                         EKG  EKG Interpretation  Date/Time:    Ventricular Rate:    PR Interval:    QRS Duration:   QT Interval:    QTC Calculation:   R Axis:     Text Interpretation:        Radiology Dg Chest Port 1 View  Result Date: 01/07/2018 CLINICAL DATA:  Altered mental status with leukocytosis EXAM: PORTABLE CHEST 1 VIEW COMPARISON:  12/12/2017 FINDINGS: Low lung volumes. Patchy atelectasis or minimal infiltrate at the  left base with possible tiny effusion. Stable cardiomediastinal silhouette. No pneumothorax. IMPRESSION: Low lung volumes. Patchy atelectasis or minimal infiltrate at the left base with possible small left effusion Electronically Signed   By: Donavan Foil M.D.   On: 01/07/2018 22:30   Pertinent labs & imaging results that were available during my care of the patient were reviewed by me and considered in my medical decision making (see chart for details).  Medications Ordered in ED Medications  morphine 4 MG/ML injection 2 mg (2 mg Intravenous Given 01/07/18 2216)  diclofenac sodium (VOLTAREN) 1 % transdermal gel 4 g (not administered)  lamoTRIgine (LAMICTAL) tablet 100 mg (not administered)  oxyCODONE (OXYCONTIN) 12 hr tablet 20 mg (not administered)  polyethylene glycol (MIRALAX / GLYCOLAX) packet 17 g (not administered)  propranolol (INDERAL) tablet 10 mg (not administered)  senna-docusate (Senokot-S) tablet 2 tablet (not administered)  venlafaxine XR (EFFEXOR-XR) 24 hr capsule 37.5 mg (not administered)  0.9 %  sodium chloride infusion ( Intravenous New Bag/Given 01/07/18 2320)  ondansetron  (ZOFRAN) tablet 4 mg (not administered)    Or  ondansetron (ZOFRAN) injection 4 mg (not administered)  acetaminophen (TYLENOL) tablet 650 mg (not administered)    Or  acetaminophen (TYLENOL) suppository 650 mg (not administered)  potassium chloride 10 mEq in 100 mL IVPB (10 mEq Intravenous New Bag/Given 01/07/18 2318)  ondansetron (ZOFRAN-ODT) disintegrating tablet 4 mg (4 mg Oral Given 01/07/18 1724)  sodium chloride 0.9 % bolus 1,000 mL (0 mLs Intravenous Stopped 01/07/18 1849)  potassium chloride SA (K-DUR,KLOR-CON) CR tablet 40 mEq (40 mEq Oral Given 01/07/18 1917)  oxyCODONE-acetaminophen (PERCOCET/ROXICET) 5-325 MG per tablet 1 tablet (1 tablet Oral Given 01/07/18 1917)  sodium chloride 0.9 % bolus 1,000 mL (0 mLs Intravenous Stopped 01/07/18 2100)                                                                                                                                    Procedures Procedures  (including critical care time)  Medical Decision Making / ED Course I have reviewed the nursing notes for this encounter and the patient's prior records (if available in EHR or on provided paperwork).  Clinical Course as of Jan 08 2352  Wed Jan 07, 2018  1756 I discussed the case with hospice social work and the patient's power of attorney (her sister Gilmore Laroche).  Family agreed to obtain screening labs and provide patient with IV hydration while we address the concern regarding her home care.  Case management and our social worker has been consulted regarding evaluation and possible placement.   [PC]  2049 Case management evaluate the patient and spoke extensively with family and hospice team.  We agreed to admit the patient under GIP for evaluation of possible reversible etiology of the patient's altered mental status including dehydration or possible infection.  Also obtain ammonia level given the patient's history of metastatic liver lesions.  Discussed the case with  the hospitalist who agreed  to admit the patient  [PC]    Clinical Course User Index [PC] Maxim Bedel, Grayce Sessions, MD     Final Clinical Impression(s) / ED Diagnoses Final diagnoses:  Disorientation      This chart was dictated using voice recognition software.  Despite best efforts to proofread,  errors can occur which can change the documentation meaning.   Fatima Blank, MD 01/07/18 [phone]

## 2018-01-07 NOTE — ED Triage Notes (Addendum)
Pt mets cancer diagnosed 2 weeks ago. Home with hospice of [place]. Hasnt eaten much since. Nurse states she was on methadone and that was discontinued yesterday bc there was concern she may be methadone toxic. She was hallucinating. She was talking to people in ambulance that were not there. EMS started IV in Left AC and fluids. She is confused and anxious.  RN called [facility] to find out more information. Patient does not have consistent caregiver in home. No one lives here in [place]. Husband works in [place] and leaves her home 15 hours a day. May need to pursue [facility] for placement and symptom management.   Ollen Gross is [facility] Social worker [phone]. Donnie Aho [phone] is [facility] nurse.  RN just called [employer]- no availability.

## 2018-01-07 NOTE — H&P (Signed)
History and Physical    Tally Mattox IRW:[id number] DOB: 05-19-1961 DOA: 01/07/2018  PCP: Remi Haggard, FNP  Patient coming from: Home   I have personally briefly reviewed patient's old medical records in [place]  Chief Complaint: Confusion. AMS  HPI: Chelsea Clements is a 57 y.o. female with medical history significant of HTN, COPD, epilepsia, Recently diagnosed with Pancreatic cancer metastasis to liver 11-2017, Biopsy of the liver lesion was consistent with metastatic adenocarcinoma of pancreaticobiliary origin, she was refer to Dr Irene Limbo oncologist for evaluation. Patient and family decide for best suportive care through hospice. Patient has been follow by hospice at home. She has been notice to be more confused, over last week. This was thought to be related to methadone which was recently started. Methadone was stop but patient MS didn't improved and she appears to be dehydrated. Hospice nurse refer patient to ED for evaluation. Also patient stay at home most of the time by herself.   Patient is confused, she is oriented to person and place only. She doesn't know why she is the hospital. She is complaining of abdominal pain. She denies headaches, dysuria. Per report she has not been eating well.     ED Course: Sodium 128, k 2.8, BUN 22, ammonia 41, WBC at 12, ED physician discussed with family, basic labs, IV fluids and admission for disposition. Question regarding residential hospice placement. No agressive work up per family.   Review of Systems: As per HPI otherwise 10 point review of systems negative.    Past Medical History:  Diagnosis Date  . Abdominal mass    CT scan of abdomen 11/27/2017, and her results came back CT showing multiple ill defined lesions in the liver, as well as a mass involving the head of the pancreas. hx/notes 11/28/2017  . Anxiety   . COPD (chronic obstructive pulmonary disease) ([place])   . Depression   . Epilepsy ([place])    febrile seizures  when she was young, and this changed to grand mal seizures until 2004, and then they switched to focal epilepsy, but it has been well controlled on the current AED/notes 11/28/2017  . HLD (hyperlipidemia)   . Hypertension   . Liver cancer ([place]) dx'd 11/28/2017  . Myocardial infarction [facility])    [name] 2017; "AMI; age undetermined" (11/28/2017)  . Pancreas cancer ([place]) dx'd 11/28/2017  . Pneumonia ~ 2012X 1    Past Surgical History:  Procedure Laterality Date  . ESOPHAGOGASTRODUODENOSCOPY (EGD) WITH PROPOFOL N/A 11/30/2017   Procedure: ESOPHAGOGASTRODUODENOSCOPY (EGD) WITH PROPOFOL;  Surgeon: Yetta Flock, MD;  Location: [place];  Service: Gastroenterology;  Laterality: N/A;  . TONSILLECTOMY       reports that she has been smoking cigarettes.  She has a 40.00 pack-year smoking history. she has never used smokeless tobacco. She reports that she drinks about 6.0 oz of alcohol per week. She reports that she does not use drugs.  No Known Allergies  Family History; parents deceased. unknown cause, per patient    Prior to Admission medications   Medication Sig Start Date End Date Taking? Authorizing Provider  Desvenlafaxine Succinate ER 25 MG TB24 Take 25 mg by mouth daily.    [provider]  diclofenac sodium (VOLTAREN) 1 % GEL Apply 4 g topically 4 (four) times daily. 12/03/17   Melanee Spry, MD  fenofibrate (TRICOR) 145 MG tablet Take 145 mg by mouth daily.    [provider]  fentaNYL (DURAGESIC - DOSED MCG/HR) 25 MCG/HR patch  Place 1 patch (25 mcg total) onto the skin every 3 (three) days. 12/19/17   Brunetta Genera, MD  HYDROmorphone (DILAUDID) 2 MG tablet Take 1 tablet (2 mg total) by mouth every 4 (four) hours as needed for moderate pain or severe pain. 12/19/17   Brunetta Genera, MD  ibuprofen (ADVIL,MOTRIN) 200 MG tablet Take 400-800 mg by mouth every 6 (six) hours as needed for mild pain.    [provider]  lamoTRIgine (LAMICTAL)  100 MG tablet Take 1 tablet (100 mg total) by mouth 2 (two) times daily. 12/03/17   Lacroce, Hulen Shouts, MD  ondansetron (ZOFRAN ODT) 4 MG disintegrating tablet Take 1 tablet (4 mg total) by mouth every 8 (eight) hours as needed for nausea or vomiting. 10/18/16   Gregor Hams, MD  ondansetron (ZOFRAN) 8 MG tablet Take 1 tablet (8 mg total) by mouth every 8 (eight) hours as needed for nausea. 12/19/17   Brunetta Genera, MD  oxyCODONE (OXY IR/ROXICODONE) 5 MG immediate release tablet Take 1-2 tablets (5-10 mg total) by mouth every 4 (four) hours as needed for breakthrough pain. 12/03/17   Melanee Spry, MD  oxyCODONE (OXYCONTIN) 20 mg 12 hr tablet Take 1 tablet (20 mg total) by mouth every 12 (twelve) hours. 12/03/17   Melanee Spry, MD  polyethylene glycol [facility]) packet Take 17 g by mouth daily. 12/19/17   Brunetta Genera, MD  promethazine (PHENERGAN) 12.5 MG tablet Take 1 tablet (12.5 mg total) by mouth every 6 (six) hours as needed for nausea or vomiting. Patient not taking: Reported on 12/19/2017 12/03/17   Melanee Spry, MD  propranolol (INDERAL) 10 MG tablet Take 10 mg by mouth 2 (two) times daily.    [provider]  senna-docusate (SENNA S) 8.6-50 MG tablet Take 2 tablets by mouth 2 (two) times daily. 12/19/17   Brunetta Genera, MD    Physical Exam: Vitals:   01/07/18 1830 01/07/18 1900 01/07/18 1915 01/07/18 1939  BP: 130/64 112/65 (!) 118/56 135/73  Pulse: 74 79 77 73  Resp:    18  Temp:    98.7 F (37.1 C)  TempSrc:    Oral  SpO2: 96% 96% 93% 96%    Constitutional: NAD, calm, comfortable, thin appearing.  Vitals:   01/07/18 1830 01/07/18 1900 01/07/18 1915 01/07/18 1939  BP: 130/64 112/65 (!) 118/56 135/73  Pulse: 74 79 77 73  Resp:    18  Temp:    98.7 F (37.1 C)  TempSrc:    Oral  SpO2: 96% 96% 93% 96%   Eyes: PERRL, lids and conjunctivae normal ENMT: Mucous membranes are dry. Posterior pharynx clear of any exudate or  lesions.Normal dentition.  Neck: normal, supple, no masses, no thyromegaly Respiratory: clear to auscultation bilaterally, no wheezing, no crackles. Normal respiratory effort. No accessory muscle use.  Cardiovascular: Regular rate and rhythm, no murmurs / rubs / gallops. No extremity edema. 2+ pedal pulses. No carotid bruits.  Abdomen: very tender to palpation,. Bowel sounds positive. No rigidity Musculoskeletal: no clubbing / cyanosis. No joint deformity upper and lower extremities. Good ROM, no contractures. Normal muscle tone.  Skin: no rashes, lesions, ulcers. No induration Neurologic: alert, confuse, answer few questions.   Labs on Admission: I have personally reviewed following labs and imaging studies  CBC: Recent Labs  Lab 01/07/18 1731  WBC 12.1*  NEUTROABS 7.8*  HGB 12.3  HCT 35.9*  MCV 93.7  PLT 161   Basic Metabolic Panel:  Recent Labs  Lab 01/07/18 1731  NA 134*  K 2.8*  CL 91*  CO2 29  GLUCOSE 110*  BUN 22*  CREATININE 0.97  CALCIUM 8.6*   GFR: CrCl cannot be calculated (Unknown ideal weight.). Liver Function Tests: Recent Labs  Lab 01/07/18 1913  AST 37  ALT 17  ALKPHOS 151*  BILITOT 2.8*  PROT 6.0*  ALBUMIN 2.4*   No results for input(s): LIPASE, AMYLASE in the last 168 hours. Recent Labs  Lab 01/07/18 1913  AMMONIA 41*   Coagulation Profile: No results for input(s): INR, PROTIME in the last 168 hours. Cardiac Enzymes: No results for input(s): CKTOTAL, CKMB, CKMBINDEX, TROPONINI in the last 168 hours. BNP (last 3 results) No results for input(s): PROBNP in the last 8760 hours. HbA1C: No results for input(s): HGBA1C in the last 72 hours. CBG: No results for input(s): GLUCAP in the last 168 hours. Lipid Profile: No results for input(s): CHOL, HDL, LDLCALC, TRIG, CHOLHDL, LDLDIRECT in the last 72 hours. Thyroid Function Tests: No results for input(s): TSH, T4TOTAL, FREET4, T3FREE, THYROIDAB in the last 72 hours. Anemia Panel: No results  for input(s): VITAMINB12, FOLATE, FERRITIN, TIBC, IRON, RETICCTPCT in the last 72 hours. Urine analysis:    Component Value Date/Time   COLORURINE YELLOW 12/01/2017 1006   APPEARANCEUR CLEAR 12/01/2017 1006   LABSPEC 1.003 (L) 12/01/2017 1006   PHURINE 6.0 12/01/2017 1006   GLUCOSEU 50 (A) 12/01/2017 1006   HGBUR SMALL (A) 12/01/2017 1006   BILIRUBINUR NEGATIVE 12/01/2017 1006   KETONESUR NEGATIVE 12/01/2017 1006   PROTEINUR NEGATIVE 12/01/2017 1006   NITRITE NEGATIVE 12/01/2017 1006   LEUKOCYTESUR NEGATIVE 12/01/2017 1006    Radiological Exams on Admission: No results found.  EKG: none available.   Assessment/Plan Principal Problem:   Acute metabolic encephalopathy Active Problems:   Liver metastases (HCC)   Pancreatic adenocarcinoma (HCC)   COPD (chronic obstructive pulmonary disease) (HCC)   Hypokalemia   Hyponatremia  1-Acute encephalopathy;  Could be multifactorial, related to medications, methadone, vs dehydration. Vs metastasis.  Will do work up for infection. Check UA, urine culture. Chest x ray . Check TSH Per ED physician family decline extensive test, evaluation.   2-Hyponatremia; Start IV fluids.   3-Hypokalemia; IV kcl and oral supplementation.   4-Metastatic pancreatic cancer;  Continue with pain management, oxycodone BID.  Will order also PRN morphine.  Palliative care consult for symptoms management and disposition.   5-Leukocytosis; could be reactive. Work up for infection, check UA, chest x ray.     DVT prophylaxis: SCD.  Code Status: DNR, Gold form at bedside.  Family Communication: no family at bedside.  Disposition Plan:  Needs placement, residential hospice vs nursing home  Consults called: none Admission status: inpatient, patient with metastasis cancer, pooor oral intake, needs IV pain medication for pain control.    Elmarie Shiley MD [facility] Pager [phone]  If 7PM-7AM, please contact  night-coverage [email or web address] Password [email or web address]  01/07/2018, 9:00 PM

## 2018-01-08 DIAGNOSIS — G9341 Metabolic encephalopathy: Secondary | ICD-10-CM

## 2018-01-08 LAB — BASIC METABOLIC PANEL
ANION GAP: 12 (ref 5–15)
BUN: 17 mg/dL (ref 6–20)
CALCIUM: 7.9 mg/dL — AB (ref 8.9–10.3)
CO2: 24 mmol/L (ref 22–32)
Chloride: 99 mmol/L — ABNORMAL LOW (ref 101–111)
Creatinine, Ser: 0.81 mg/dL (ref 0.44–1.00)
GLUCOSE: 94 mg/dL (ref 65–99)
POTASSIUM: 3.5 mmol/L (ref 3.5–5.1)
SODIUM: 135 mmol/L (ref 135–145)

## 2018-01-08 LAB — CBC
HCT: 34.5 % — ABNORMAL LOW (ref 36.0–46.0)
Hemoglobin: 11.8 g/dL — ABNORMAL LOW (ref 12.0–15.0)
MCH: 32 pg (ref 26.0–34.0)
MCHC: 34.2 g/dL (ref 30.0–36.0)
MCV: 93.5 fL (ref 78.0–100.0)
PLATELETS: 174 10*3/uL (ref 150–400)
RBC: 3.69 MIL/uL — AB (ref 3.87–5.11)
RDW: 14.1 % (ref 11.5–15.5)
WBC: 11.7 10*3/uL — ABNORMAL HIGH (ref 4.0–10.5)

## 2018-01-08 LAB — TSH: TSH: 0.979 u[IU]/mL (ref 0.350–4.500)

## 2018-01-08 LAB — GLUCOSE, CAPILLARY
GLUCOSE-CAPILLARY: 120 mg/dL — AB (ref 65–99)
Glucose-Capillary: 84 mg/dL (ref 65–99)

## 2018-01-08 NOTE — Progress Notes (Signed)
Initial Nutrition Assessment  DOCUMENTATION CODES:   Non-severe (moderate) malnutrition in context of chronic illness  INTERVENTION:  1. Ensure Enlive po BID, each supplement provides 350 kcal and 20 grams of protein  NUTRITION DIAGNOSIS:   Moderate Malnutrition related to chronic illness as evidenced by moderate fat depletion, moderate muscle depletion.  GOAL:   Patient will meet greater than or equal to 90% of their needs  MONITOR:   PO intake, I & O's, Labs, Supplement acceptance, Weight trends, Skin  REASON FOR ASSESSMENT:   Malnutrition Screening Tool    ASSESSMENT:    Chelsea Clements is a 57 y.o. female with medical history significant of HTN, COPD, epilepsia, Recently diagnosed with Pancreatic cancer metastasis to liver 11-2017, acute encephalopathy  Spoke with Chelsea Clements at bedside. She is still encephalopathic during my visit. Patient cannot remember many things, states she was not eating well for 1 week, only eating bites. Cannot remember what she normally eats for breakfast, lunch, or dinner. She does state that she does not walk much anymore, which is shown by severe depletions at legs. Breakfast tray was by bedside during visit, she had not eaten any of it. Unsure of weight loss. Per chart, exhibits 17 pound/12% insignificant weight loss over 1 year. No weight logged that is more recent.  Labs reviewed:  TBIli 2.8  Medications reviewed and include:  Miralax, Senokot-S NS at 133mL/hr    NUTRITION - FOCUSED PHYSICAL EXAM:    Most Recent Value  Orbital Region  Moderate depletion  Upper Arm Region  Moderate depletion  Thoracic and Lumbar Region  Moderate depletion  Buccal Region  Moderate depletion  Temple Region  Moderate depletion  Clavicle Bone Region  Moderate depletion  Clavicle and Acromion Bone Region  Moderate depletion  Scapular Bone Region  Moderate depletion  Dorsal Hand  Moderate depletion  Patellar Region  Severe depletion  Anterior Thigh  Region  Severe depletion  Posterior Calf Region  Severe depletion  Edema (RD Assessment)  None  Hair  Reviewed  Eyes  Reviewed  Mouth  Reviewed  Skin  Reviewed  Nails  Reviewed       Diet Order:  Diet regular Room service appropriate? Yes; Fluid consistency: Thin  EDUCATION NEEDS:   Not appropriate for education at this time  Skin:  Skin Assessment: Skin Integrity Issues: Skin Integrity Issues:: Other (Comment) Other: Ecchymosis to R Leg  Last BM:  01/06/2018  Height:   Ht Readings from Last 1 Encounters:  01/07/18 5\' 3"  (1.6 m)    Weight:   Wt Readings from Last 1 Encounters:  01/07/18 123 lb 7.3 oz (56 kg)    Ideal Body Weight:  52.27 kg  BMI:  Body mass index is 21.87 kg/m.  Estimated Nutritional Needs:   Kcal:  6734-1937 calories  Protein:  78-89 grams (1.4-1.6g/kg)  Fluid:  1.7-2L   Satira Anis. Gianny Sabino, MS, RD LDN Inpatient Clinical Dietitian Pager [phone]

## 2018-01-08 NOTE — Consult Note (Signed)
Consultation Note Date: 01/08/2018   Patient Name: Chelsea Clements  DOB: 31-Mar-1961  MRN: [id number]  Age / Sex: 57 y.o., female  PCP: Remi Haggard, FNP Referring Physician: Cristy Folks, MD  Reason for Consultation: Disposition and Pain control  HPI/Patient Profile: 57 y.o. female  with past medical history of stage IV pancreatic cancer admitted on 01/07/2018 with increasing confusion.   Clinical Assessment and Goals of Care:   Chelsea Clements is a 57yo with PMH relevant for HTN, seizure, COPD, pancreatic adenocarcinoma c/b metastatic disease to the liver currently on hospice who was admitted from hospice for increasing confusion. Patient was noted to have become confused, dehydrated and was brought to the hospital. She is on IVF, her pain regimen is being judiciously monitored. [facility] is following. She is currently admitted to hospital medicine service.   A palliative consult has been requested for additional supportive care.   The patient is resting in bed, she is in no distress, she denies pain. [facility] notes reviewed, discussed with sister over the phone, see below.    HCPOA  patient lives in [place], [place]    DNR DNI Home with hospice, once arrangements have been completed by family, discussed with sister, they're hiring a Magazine features editor, patient's daughter is going to move in the patient's home for a few days, goals are for the patient to be kept as comfortable as possible, in her own house.  Agree with current pain regimen  Code Status/Advance Care Planning:  DNR    Symptom Management:     As above  Palliative Prophylaxis:  Bowel Regimen   Psycho-social/Spiritual:   Desire for further Chaplaincy support:yes  Additional Recommendations: Education on Hospice  Prognosis:   < 3 months  Discharge Planning: Home with Hospice      Primary  Diagnoses: Present on Admission: . Liver metastases ([place]) . Pancreatic adenocarcinoma ([place]) . Encephalopathy acute   I have reviewed the medical record, interviewed the patient and family, and examined the patient. The following aspects are pertinent.  Past Medical History:  Diagnosis Date  . Abdominal mass    CT scan of abdomen 11/27/2017, and her results came back CT showing multiple ill defined lesions in the liver, as well as a mass involving the head of the pancreas. hx/notes 11/28/2017  . Anxiety   . COPD (chronic obstructive pulmonary disease) ([place])   . Depression   . Epilepsy ([place])    febrile seizures when she was young, and this changed to grand mal seizures until 2004, and then they switched to focal epilepsy, but it has been well controlled on the current AED/notes 11/28/2017  . HLD (hyperlipidemia)   . Hypertension   . Liver cancer ([place]) dx'd 11/28/2017  . Myocardial infarction [facility])    Chelsea Clements 2017; "AMI; age undetermined" (11/28/2017)  . Pancreas cancer ([place]) dx'd 11/28/2017  . Pneumonia ~ 2012X 1   Social History   Socioeconomic History  . Marital status: Divorced    Spouse name: Not on file  .  Number of children: Not on file  . Years of education: Not on file  . Highest education level: Not on file  Social Needs  . Financial resource strain: Not on file  . Food insecurity - worry: Not on file  . Food insecurity - inability: Not on file  . Transportation needs - medical: Not on file  . Transportation needs - non-medical: Not on file  Occupational History  . Not on file  Tobacco Use  . Smoking status: Current Every Day Smoker    Packs/day: 1.00    Years: 40.00    Pack years: 40.00    Types: Cigarettes  . Smokeless tobacco: Never Used  Substance and Sexual Activity  . Alcohol use: Yes    Alcohol/week: 6.0 oz    Types: 10 Cans of beer per week  . Drug use: No  . Sexual activity: No  Other Topics Concern  . Not on file  Social History Narrative  . Not  on file   No family history on file. Scheduled Meds: . diclofenac sodium  4 g Topical TID AC & HS  . lamoTRIgine  100 mg Oral BID  . oxyCODONE  20 mg Oral Q12H  . polyethylene glycol  17 g Oral Daily  . propranolol  10 mg Oral BID  . senna-docusate  2 tablet Oral BID  . venlafaxine XR  37.5 mg Oral Daily   Continuous Infusions: . sodium chloride 100 mL/hr at 01/08/18 0901   PRN Meds:.acetaminophen **OR** acetaminophen, morphine injection, ondansetron **OR** ondansetron (ZOFRAN) IV Medications Prior to Admission:  Prior to Admission medications   Medication Sig Start Date End Date Taking? Authorizing Provider  Desvenlafaxine Succinate ER 25 MG TB24 Take 25 mg by mouth daily.    [provider]  diclofenac sodium (VOLTAREN) 1 % GEL Apply 4 g topically 4 (four) times daily. 12/03/17   Melanee Spry, MD  fenofibrate (TRICOR) 145 MG tablet Take 145 mg by mouth daily.    [provider]  fentaNYL (DURAGESIC - DOSED MCG/HR) 25 MCG/HR patch Place 1 patch (25 mcg total) onto the skin every 3 (three) days. 12/19/17   Brunetta Genera, MD  HYDROmorphone (DILAUDID) 2 MG tablet Take 1 tablet (2 mg total) by mouth every 4 (four) hours as needed for moderate pain or severe pain. 12/19/17   Brunetta Genera, MD  ibuprofen (ADVIL,MOTRIN) 200 MG tablet Take 400-800 mg by mouth every 6 (six) hours as needed for mild pain.    [provider]  lamoTRIgine (LAMICTAL) 100 MG tablet Take 1 tablet (100 mg total) by mouth 2 (two) times daily. 12/03/17   Lacroce, Hulen Shouts, MD  ondansetron (ZOFRAN ODT) 4 MG disintegrating tablet Take 1 tablet (4 mg total) by mouth every 8 (eight) hours as needed for nausea or vomiting. 10/18/16   Gregor Hams, MD  ondansetron (ZOFRAN) 8 MG tablet Take 1 tablet (8 mg total) by mouth every 8 (eight) hours as needed for nausea. 12/19/17   Brunetta Genera, MD  oxyCODONE (OXY IR/ROXICODONE) 5 MG immediate release tablet Take 1-2 tablets (5-10  mg total) by mouth every 4 (four) hours as needed for breakthrough pain. 12/03/17   Melanee Spry, MD  oxyCODONE (OXYCONTIN) 20 mg 12 hr tablet Take 1 tablet (20 mg total) by mouth every 12 (twelve) hours. 12/03/17   Melanee Spry, MD  polyethylene glycol [facility]) packet Take 17 g by mouth daily. 12/19/17   Brunetta Genera, MD  promethazine (  PHENERGAN) 12.5 MG tablet Take 1 tablet (12.5 mg total) by mouth every 6 (six) hours as needed for nausea or vomiting. Patient not taking: Reported on 12/19/2017 12/03/17   Melanee Spry, MD  propranolol (INDERAL) 10 MG tablet Take 10 mg by mouth 2 (two) times daily.    [provider]  senna-docusate (SENNA S) 8.6-50 MG tablet Take 2 tablets by mouth 2 (two) times daily. 12/19/17   Brunetta Genera, MD   No Known Allergies Review of Systems Denies pain  Physical Exam Opens eyes and answers appropriately In no distress Resting in bed Clear breath sounds Thin weak appearing lady Abdomen soft, not distended, mild generalized tenderness S1 S2  Vital Signs: BP (!) 113/57 (BP Location: Right Arm)   Pulse 72   Temp 98.7 F (37.1 C) (Oral)   Resp 16   Ht 5\' 3"  (1.6 m)   Wt 56 kg (123 lb 7.3 oz)   SpO2 96%   BMI 21.87 kg/m  Pain Assessment: 0-10   Pain Score: 0-No pain   SpO2: SpO2: 96 % O2 Device:SpO2: 96 % O2 Flow Rate: .   IO: Intake/output summary:   Intake/Output Summary (Last 24 hours) at 01/08/2018 1521 Last data filed at 01/08/2018 1500 Gross per 24 hour  Intake 3666.67 ml  Output -  Net 3666.67 ml    LBM: Last BM Date: 01/06/18 Baseline Weight: Weight: 56 kg (123 lb 7.3 oz) Most recent weight: Weight: 56 kg (123 lb 7.3 oz)     Palliative Assessment/Data:   PPS 30 to 40%  Time In:  1410 Time Out:  1510 Time Total:  60  Greater than 50%  of this time was spent counseling and coordinating care related to the above assessment and plan.  Signed by: Loistine Chance, MD  [phone]  Please  contact Palliative Medicine Team phone at [phone] for questions and concerns.  For individual provider: See Shea Evans

## 2018-01-08 NOTE — Progress Notes (Signed)
PROGRESS NOTE    Chelsea Clements  VOH:[id number] DOB: 02-14-1961 DOA: 01/07/2018 PCP: Remi Haggard, FNP   Brief Narrative: Mrs. Chelsea Clements is a 57yo with PMH relevant for HTN, seizure, COPD, pancreatic adenocarcinoma c/b metastatic disease to the liver currently on hospice who was admitted from hospice for increasing confusion.    Assessment & Plan:   Principal Problem:   Acute metabolic encephalopathy Active Problems:   Liver metastases (HCC)   Pancreatic adenocarcinoma (HCC)   COPD (chronic obstructive pulmonary disease) (HCC)   Hypokalemia   Hyponatremia   Encephalopathy acute   Mrs. Chelsea Clements is a 57yo with PMH relevant for HTN, seizure, COPD, pancreatic adenocarcinoma c/b metastatic disease to the liver currently on hospice who was admitted from hospice for increasing confusion.   ##) Acute encephalopathy: unclear etiology however strongly suspect medications including fentanyl patch, oxycontin 20mg  q12h, PRN oxycodone, oral hydromorphone and methadone - improving on holding meds - restart home pains slowly  ##) Stage IV pancreatic cancer: Case management discussed with the patient's husband who would provide private pay caregiver to provide increased support at home. - continue morphine - cont PEG and senna-docustate  ##) HTN: - cont propranolol 10mg  BID  ##) Seizure d/o: - cont lamotrigine 100mg  BID  ##) Mood d/o: - cont venlafaxine 37.5mg  QD  FEN: - fluids: IVF - electrolytes: monitor and supp - nutrition: regular diet  Prophy: not needed  Dispo: pending d/c tomorrow back to hospice  DNAR    Consultants:   Palliative care  Subjective: Pt is fairly sleepy however she reports only 3/10 pain that she cannot localize. She otherwise denies any cough, congestion, rhinorreha, pain.   Objective: Vitals:   01/07/18 2223 01/07/18 2305 01/08/18 0621 01/08/18 1416  BP: 113/70 124/62 (!) 99/57 (!) 113/57  Pulse: 78 76 69 72  Resp: (!) 26 18 16 16     Temp:  98.5 F (36.9 C) 98 F (36.7 C) 98.7 F (37.1 C)  TempSrc:  Oral Oral Oral  SpO2: 94% 93% 96% 96%  Weight:  56 kg (123 lb 7.3 oz)    Height:  5\' 3"  (1.6 m)      Intake/Output Summary (Last 24 hours) at 01/08/2018 1539 Last data filed at 01/08/2018 1500 Gross per 24 hour  Intake 3666.67 ml  Output -  Net 3666.67 ml   Filed Weights   01/07/18 2305  Weight: 56 kg (123 lb 7.3 oz)    Examination:  General exam: Appears comfortable, quite sleepy Respiratory system: diminished breath sounds Cardiovascular system: S1 & S2 heard, RRR. trace pedal edema. Gastrointestinal system: Abdomen is distended but soft and tender only to deep palpation  Central nervous system: No focal neurological deficits. Skin: No rashes Psychiatry: Judgement and insight appear normal.    Data Reviewed: I have personally reviewed following labs and imaging studies  CBC: Recent Labs  Lab 01/07/18 1731 01/08/18 0019  WBC 12.1* 11.7*  NEUTROABS 7.8*  --   HGB 12.3 11.8*  HCT 35.9* 34.5*  MCV 93.7 93.5  PLT 186 694   Basic Metabolic Panel: Recent Labs  Lab 01/07/18 1731 01/08/18 0019  NA 134* 135  K 2.8* 3.5  CL 91* 99*  CO2 29 24  GLUCOSE 110* 94  BUN 22* 17  CREATININE 0.97 0.81  CALCIUM 8.6* 7.9*   GFR: Estimated Creatinine Clearance: 62.6 mL/min (by C-G formula based on SCr of 0.81 mg/dL). Liver Function Tests: Recent Labs  Lab 01/07/18 1913  AST 37  ALT 17  ALKPHOS  151*  BILITOT 2.8*  PROT 6.0*  ALBUMIN 2.4*   No results for input(s): LIPASE, AMYLASE in the last 168 hours. Recent Labs  Lab 01/07/18 1913  AMMONIA 41*   Coagulation Profile: No results for input(s): INR, PROTIME in the last 168 hours. Cardiac Enzymes: No results for input(s): CKTOTAL, CKMB, CKMBINDEX, TROPONINI in the last 168 hours. BNP (last 3 results) No results for input(s): PROBNP in the last 8760 hours. HbA1C: No results for input(s): HGBA1C in the last 72 hours. CBG: Recent Labs  Lab  01/08/18 0800 01/08/18 1205  GLUCAP 120* 84   Lipid Profile: No results for input(s): CHOL, HDL, LDLCALC, TRIG, CHOLHDL, LDLDIRECT in the last 72 hours. Thyroid Function Tests: Recent Labs    01/08/18 0019  TSH 0.979   Anemia Panel: No results for input(s): VITAMINB12, FOLATE, FERRITIN, TIBC, IRON, RETICCTPCT in the last 72 hours. Sepsis Labs: No results for input(s): PROCALCITON, LATICACIDVEN in the last 168 hours.  No results found for this or any previous visit (from the past 240 hour(s)).       Radiology Studies: Dg Chest Port 1 View  Result Date: 01/07/2018 CLINICAL DATA:  Altered mental status with leukocytosis EXAM: PORTABLE CHEST 1 VIEW COMPARISON:  12/12/2017 FINDINGS: Low lung volumes. Patchy atelectasis or minimal infiltrate at the left base with possible tiny effusion. Stable cardiomediastinal silhouette. No pneumothorax. IMPRESSION: Low lung volumes. Patchy atelectasis or minimal infiltrate at the left base with possible small left effusion Electronically Signed   By: Donavan Foil M.D.   On: 01/07/2018 22:30        Scheduled Meds: . diclofenac sodium  4 g Topical TID AC & HS  . lamoTRIgine  100 mg Oral BID  . oxyCODONE  20 mg Oral Q12H  . polyethylene glycol  17 g Oral Daily  . propranolol  10 mg Oral BID  . senna-docusate  2 tablet Oral BID  . venlafaxine XR  37.5 mg Oral Daily   Continuous Infusions: . sodium chloride 100 mL/hr at 01/08/18 0901     LOS: 1 day    Time spent: 88    Cristy Folks, MD [facility] Pager [phone]  If 7PM-7AM, please contact night-coverage [email or web address] Password [facility] 01/08/2018, 3:39 PM

## 2018-01-08 NOTE — Progress Notes (Signed)
Pt's family is putting a plan in place for pt to have consistent caregiving at home.  Family members as well as paid caregivers will provide care.  [place], [place]

## 2018-01-08 NOTE — Care Management Note (Signed)
Case Management Note  Patient Details  Name: Chelsea Clements MRN: [id number] Date of Birth: Nov 02, 1961  Subjective/Objective:       Presented with confusion, AMS. Hx of HTN, COPD, epilepsia, Pancreatic cancer metastasis to liver. From home alone. PTA active with [facility]       Ned Grace (Sister) Rachel Moulds (Sister)     [phone]         PCP:  Action/Plan: Transition back to home with hospice care on tomorrow. Per primary caregiver,Tricia, states 24/7 supervision will be in place for pt on discharge.  Expected Discharge Date:   01/09/2018           Expected Discharge Plan:  Home w Hospice Care  In-House Referral:     Discharge planning Services  CM Consult  Post Acute Care Choice:  Resumption of Svcs/PTA Provider Choice offered to:  Patient  DME Arranged:    DME Agency:     HH Arranged:   home hospice [facility] Agency:  [facility]  Status of Service:  Completed, signed off  If discussed at [place] of Stay Meetings, dates discussed:    Additional Comments:  Sharin Mons, RN 01/08/2018, 1:57 PM

## 2018-01-08 NOTE — Progress Notes (Signed)
[facility] Liaison:  RN  Spoke with Threasa Beards, RN, PMT.  She advised that PMT MD saw patient and recommends BP for "tweaking methadone dosage".  As patients symptoms are being managed well on current medications in the hospital without the methadone and patient is wanting to return home.  I have discussed with [facility] Director of Patient Care, Abigail Butts, RN - who advised okay for plan of care to go home with hospice following, since there will be caretakers in the home.  I did express same to PMT.  [facility] appreciates PMT taking the time to go see the patient and offering of recommendations.   Thank you.  Edyth Gunnels, RN, BSN [facility] Liaison [phone]  All hospital liaisons are now on [place].

## 2018-01-08 NOTE — Progress Notes (Signed)
Pt. Had 2 episodes of black sticky stool. MD made aware

## 2018-01-08 NOTE — Progress Notes (Signed)
Patient unable to void since admission at 2300 01/07/2017. Patient states she hasn't voided since Tuesday and she has no urge to void.  RN bladder scanned patient and resulted 279 mL in bladder.  On-call NP, K. Schorr notified.  Patient went back to sleep after scan, no signs of distress.  Will continue to monitor patient and await NP orders.

## 2018-01-08 NOTE — Progress Notes (Addendum)
[place] [facility] - GIP RN visit at 0915am.  This is a related and covered GIP admission of 01/07/18 with [facility] diagnosis of carcinoma of the pancreas, per Dr. Karie Georges.  Patient has [facility] DNR.  [facility] RN activated EMS after patient found at home during scheduled visit and had altered mental status.  Patient was admitted for same.   Met with patient this morning at bedside.  Patient was alert and oriented this morning.  Patient states that she doesn't remember a lot about yesterday and admission to the hospital.  Patient denies pain at this time.  Patient still weak, hard to roll over, hard to sit up per patient report.  Patient states she hasn't been eating a lot.  I asked patient what she wanted to do, regarding care.  She insists that she wants to go home and get private care in the home to help her.  She admits that she doesn't have any care in the home regularly.  She states that she does want to go to [facility] at end of life, "because I don't want to die in my home for my kids to see me".  Patient does complain that she hasn't been able to urinate since Tuesday.  Bedside RN was in the room and doing a bladder scan this morning.  Small amount of residual.  Patient admits to non-compliance of medications in the home.  It appears that it is difficult for her to maneuver around at home secondary to weakness at times.  Per Dr. Karie Georges, okay for patient to go to [facility] for symptom management if she will agree.  No bed availability today.  Working with Eustaquio Maize, [facility] SW to clarify plan for patient upon leaving the hospital.    UPDATE:  Per [facility] SW:  She spoke with patient's sister and patient's daughter will be at the home through the weekend.  They are interviewing a paid caregiver for the home.    Patient currently receiving:  Diclofenac sodium (VOLTAREN) 1% transdermal gel 4 g, Dose 4 g, TID before meals and bedtime via TP; lamoTRIgine (LAMICTAL) tablet 100 mg,  Dose 100 mg, BID via PO; oxyCODONE (OXYCONTIN) 12 HR tablet 20 mg, Dose 20 mg, Q12H via PO; polyethylene glycol (MIRALAX/GLYCOLAX) packet 17 g, Dose 17 g, QD via po; propanolol (INDERAL) tablet 10 mg, Dose 10 mg, BID via PO; senna-docusate (Senokot-S) tablet 2 tablet, Dose 2 tablet, BID via PO; venlafaxine XR (EFFEXOR-XR) 24 hr capsule 37.5 mg, Dose 37.5 mg, QD via PO.  Continuous medications:  0.9% sodium chloride infusion, Rate 100 mL/hr via IV.  Patient has received no PRN medications at this time.   I had made Dr. Karie Georges and Dr. Lavena Bullion aware of admission.  Transfer summary and medication list to be placed on the shadow chart.    We will continue to follow patient while she is hospitalized.   Thank you.  Edyth Gunnels, RN, BSN [facility] Liaison [phone]  All hospital liaisons are now on [place].    UPDATE:  Spoke with Levada Dy, CMRN at [phone], she advised will follow up for plan of discharge and will let me know as soon as she has information.

## 2018-01-09 ENCOUNTER — Other Ambulatory Visit: Payer: Self-pay

## 2018-01-09 ENCOUNTER — Encounter (HOSPITAL_COMMUNITY): Payer: Self-pay

## 2018-01-09 MED ORDER — ENSURE ENLIVE PO LIQD: 237.0000 mL | Freq: Two times a day (BID) | ORAL | Status: DC

## 2018-01-09 NOTE — Progress Notes (Signed)
[place] [facility] [place] - GIP RN visit at 0830am.  This is a related and covered GIP admission of 01/07/18 with [facility] diagnosis of carcinoma of the pancreas, per Dr. Karie Georges.  Patient has [place] DNR.  [facility] RN activated EMS after patient found at home during scheduled visit and had altered mental status.  Patient was admitted for same.   Met with patient at bedside this morning.  Patient was lethargic and oriented x 3 this morning.  Patient states she was having pain and that she didn't feel like eating this morning.  Per patient, she is eating less and less.  She states she doesn't feel like eating when she is in pain.  I advised to hit bedside RN button to let them know of her pain, to ensure that she could get pain medication.  She advised that she is ready to go home today and looking forward to seeing her daughter and sister in her home.    I did speak with the Gilmore Laroche, sister, yesterday to see if they needed any equipment in the home for discharge.  She declined at this time.  Saying that she had a bed and they had just ordered her a recliner for home.      Patient currently receiving:  Diclofenac sodium (VOLTAREN) 1% transdermal gel 4 g, Dose 4 g, TID before meals and bedtime via TP; feeding supplement (ENSURE ENLIVE) liquid 237 mL, Dose 237 mL, BID between meals via PO; lamoTRIgine (LAMICTAL) tablet 100 mg, Dose 100 mg, BID via PO; oxyCODONE (OXYCONTIN) 12 HR tablet 20 mg, Dose 20 mg, Q12H via PO; polyethylene glycol (MIRALAX/GLYCOLAX) packet 17 g, Dose 17 g, QD via po; propanolol (INDERAL) tablet 10 mg, Dose 10 mg, BID via PO; senna-docusate (Senokot-S) tablet 2 tablet, Dose 2 tablet, BID via PO; venlafaxine XR (EFFEXOR-XR) 24 hr capsule 37.5 mg, Dose 37.5 mg, QD via PO.  Continuous medications:  0.9% sodium chloride infusion, Rate 100 mL/hr via IV.  Patient has received no PRN medications at this time.   We will continue to monitor patient while hospitalized.  As  patient is a current [facility] patient, she will need to use [facility] for transport home from the hospital.  I will update CMRN on same.   Thank you.  Edyth Gunnels, RN, BSN [facility] Liaison [phone]  All hospital liaisons are now on [place].

## 2018-01-09 NOTE — Progress Notes (Signed)
PMT progress note  Ms Sipe is resting in bed with eyes closed, she opens her eyes when her name is called, feels weak, but engages and is pleasant and interactive, asking for a sip of water, breakfast tray is untouched, patient state she has no appetite, does not appear to be in acute distress, no family at bedside, discussed with sister over the phone in detail on 01-08-18.   BP 110/68 (BP Location: Right Arm)   Pulse 67   Temp 98 F (36.7 C) (Oral)   Resp 18   Ht 5\' 3"  (1.6 m)   Wt 56 kg (123 lb 7.3 oz)   SpO2 96%   BMI 21.87 kg/m  Labs and imaging noted Med history noted  Appears weak and chronically ill Regular Thin weak lady S1 S2 Abdomen soft No edema Muscle wasting Non focal  A/P: Life limiting illness of pancreatic adenocarcinoma Enrolled with [facility], home with hospice program Admitted with dehydration, confusion. Also has history of HTN and seizure.   Discussed with patient, discussed with Amy Amalia Hailey, [facility], appreciate their input and follow up, plan for home with hospice on discharge, patient eventually will need transfer to residential hospice for aggressive symptom management at end of life, for now, wants to be home, daughter and sister are arranging for paid caregivers at home.   Prognosis few weeks to some very limited number of months at this point.  25 minutes spent [facility] palliative medicine team [phone]

## 2018-01-09 NOTE — Care Management Note (Addendum)
Case Management Note  Patient Details  Name: Shaylene Paganelli MRN: [id number] Date of Birth: 04-27-1961  Subjective/Objective:      Confusion/AMS. Hx of pancreatic Ca / mets to liver.             Action/Plan: Transition to home with the resumption of hospice care/ [facility]. CM will call and arrange transportation to home with [facility] when pt ready.  Expected Discharge Date:    01/09/2018            Expected Discharge Plan:  Home w Hospice Care   Discharge planning Services  CM Consult  Post Acute Care Choice:  Resumption of Svcs/PTA Provider Choice offered to:  Patient HH Arranged:   home hospice [facility] Agency:  [facility]  Status of Service:  Completed, signed off  If discussed at [place] of Stay Meetings, dates discussed:    Additional Comments:  Sharin Mons, RN 01/09/2018, 11:11 AM

## 2018-01-09 NOTE — Discharge Summary (Signed)
Physician Discharge Summary  Chelsea Clements DEY:[id number] DOB: Mar 12, 1961 DOA: 01/07/2018  PCP: Remi Haggard, FNP  Admit date: 01/07/2018 Discharge date: 01/09/2018  Admitted From: Home hospice  Disposition:  [place]: Yes (YES/NO) Equipment/Devices: N/A  Discharge Condition: hospice CODE STATUS: DNR Diet recommendation: regular  Brief/Interim Summary:  ##) Acute encephalopathy: Patient presented with decreased mentation.  This was thought to be secondary to multiple opiate medications which patient takes for pain.  These were held until her mental status improved.  They were subsequently restarted at lower doses.  Her methadone was decreased in dose.  ##) Metastatic pancreatic cancer: Patient is is currently on hospice for widely metastatic pancreatic adenocarcinoma.  She will be discharged home with home hospice.  Discharge Diagnoses:  Principal Problem:   Acute metabolic encephalopathy Active Problems:   Liver metastases (HCC)   Pancreatic adenocarcinoma (HCC)   COPD (chronic obstructive pulmonary disease) (HCC)   Hypokalemia   Hyponatremia   Encephalopathy acute    Discharge Instructions  Discharge Instructions    Diet general   Complete by:  As directed      Allergies as of 01/09/2018   No Known Allergies     Medication List    TAKE these medications   Desvenlafaxine Succinate ER 25 MG Tb24 Take 25 mg by mouth daily.   fenofibrate 145 MG tablet Commonly known as:  TRICOR Take 145 mg by mouth daily.   fentaNYL 25 MCG/HR patch Commonly known as:  DURAGESIC - dosed mcg/hr Place 1 patch (25 mcg total) onto the skin every 3 (three) days.   HYDROmorphone 2 MG tablet Commonly known as:  DILAUDID Take 1 tablet (2 mg total) by mouth every 4 (four) hours as needed for moderate pain or severe pain.   ibuprofen 200 MG tablet Commonly known as:  ADVIL,MOTRIN Take 400-800 mg by mouth every 6 (six) hours as needed for mild pain.    lamoTRIgine 100 MG tablet Commonly known as:  LAMICTAL Take 1 tablet (100 mg total) by mouth 2 (two) times daily.   methadone 5 MG tablet Commonly known as:  DOLOPHINE Take 5 mg by mouth every 8 (eight) hours.   ondansetron 4 MG disintegrating tablet Commonly known as:  ZOFRAN ODT Take 1 tablet (4 mg total) by mouth every 8 (eight) hours as needed for nausea or vomiting.   ondansetron 8 MG tablet Commonly known as:  ZOFRAN Take 1 tablet (8 mg total) by mouth every 8 (eight) hours as needed for nausea.   oxyCODONE 5 MG immediate release tablet Commonly known as:  Oxy IR/ROXICODONE Take 1-2 tablets (5-10 mg total) by mouth every 4 (four) hours as needed for breakthrough pain.   oxyCODONE 20 mg 12 hr tablet Commonly known as:  OXYCONTIN Take 1 tablet (20 mg total) by mouth every 12 (twelve) hours.   polyethylene glycol packet Commonly known as:  MIRALAX Take 17 g by mouth daily.   promethazine 12.5 MG tablet Commonly known as:  PHENERGAN Take 1 tablet (12.5 mg total) by mouth every 6 (six) hours as needed for nausea or vomiting.   propranolol 10 MG tablet Commonly known as:  INDERAL Take 10 mg by mouth 2 (two) times daily.   senna-docusate 8.6-50 MG tablet Commonly known as:  SENNA S Take 2 tablets by mouth 2 (two) times daily.      Follow-up Information    [facility].   Specialty:  Hospice and Palliative Medicine Contact information:  [place] [address] [phone]          No Known Allergies    Procedures/Studies: Dg Ribs Unilateral W/chest Right  Result Date: 12/12/2017 CLINICAL DATA:  Right rib and right breast pain.  November 28, 2017 EXAM: RIGHT RIBS AND CHEST - 3+ VIEW COMPARISON:  None. FINDINGS: No fracture or other bone lesions are seen involving the ribs. There is no evidence of pneumothorax or pleural effusion. Both lungs are clear. Heart size and mediastinal contours are within normal limits.  Prominent right pericardial pad is identified unchanged. IMPRESSION: No acute fracture or dislocation of right ribs. Electronically Signed   By: Abelardo Diesel M.D.   On: 12/12/2017 20:06   Dg Chest Port 1 View  Result Date: 01/07/2018 CLINICAL DATA:  Altered mental status with leukocytosis EXAM: PORTABLE CHEST 1 VIEW COMPARISON:  12/12/2017 FINDINGS: Low lung volumes. Patchy atelectasis or minimal infiltrate at the left base with possible tiny effusion. Stable cardiomediastinal silhouette. No pneumothorax. IMPRESSION: Low lung volumes. Patchy atelectasis or minimal infiltrate at the left base with possible small left effusion Electronically Signed   By: Donavan Foil M.D.   On: 01/07/2018 22:30     Subjective:   Discharge Exam: Vitals:   01/08/18 2115 01/09/18 0419  BP: 118/63 110/68  Pulse: 85 67  Resp: 18 18  Temp: 98.7 F (37.1 C) 98 F (36.7 C)  SpO2: 96% 96%   Vitals:   01/08/18 0621 01/08/18 1416 01/08/18 2115 01/09/18 0419  BP: (!) 99/57 (!) 113/57 118/63 110/68  Pulse: 69 72 85 67  Resp: 16 16 18 18   Temp: 98 F (36.7 C) 98.7 F (37.1 C) 98.7 F (37.1 C) 98 F (36.7 C)  TempSrc: Oral Oral Oral Oral  SpO2: 96% 96% 96% 96%  Weight:      Height:        General: Pt is alert, sleepy but conversant Cardiovascular: RRR Respiratory: CTA bilaterally, no wheezing, no rhonchi Abdominal: Soft, distentended, diminished BS Extremities: no edema    The results of significant diagnostics from this hospitalization (including imaging, microbiology, ancillary and laboratory) are listed below for reference.     Microbiology: No results found for this or any previous visit (from the past 240 hour(s)).   Labs: BNP (last 3 results) No results for input(s): BNP in the last 8760 hours. Basic Metabolic Panel: Recent Labs  Lab 01/07/18 1731 01/08/18 0019  NA 134* 135  K 2.8* 3.5  CL 91* 99*  CO2 29 24  GLUCOSE 110* 94  BUN 22* 17  CREATININE 0.97 0.81  CALCIUM 8.6*  7.9*   Liver Function Tests: Recent Labs  Lab 01/07/18 1913  AST 37  ALT 17  ALKPHOS 151*  BILITOT 2.8*  PROT 6.0*  ALBUMIN 2.4*   No results for input(s): LIPASE, AMYLASE in the last 168 hours. Recent Labs  Lab 01/07/18 1913  AMMONIA 41*   CBC: Recent Labs  Lab 01/07/18 1731 01/08/18 0019  WBC 12.1* 11.7*  NEUTROABS 7.8*  --   HGB 12.3 11.8*  HCT 35.9* 34.5*  MCV 93.7 93.5  PLT 186 174   Cardiac Enzymes: No results for input(s): CKTOTAL, CKMB, CKMBINDEX, TROPONINI in the last 168 hours. BNP: Invalid input(s): POCBNP CBG: Recent Labs  Lab 01/08/18 0800 01/08/18 1205  GLUCAP 120* 84   D-Dimer No results for input(s): DDIMER in the last 72 hours. Hgb A1c No results for input(s): HGBA1C in the last 72 hours. Lipid Profile No results for  input(s): CHOL, HDL, LDLCALC, TRIG, CHOLHDL, LDLDIRECT in the last 72 hours. Thyroid function studies Recent Labs    01/08/18 0019  TSH 0.979   Anemia work up No results for input(s): VITAMINB12, FOLATE, FERRITIN, TIBC, IRON, RETICCTPCT in the last 72 hours. Urinalysis    Component Value Date/Time   COLORURINE YELLOW 12/01/2017 1006   APPEARANCEUR CLEAR 12/01/2017 1006   LABSPEC 1.003 (L) 12/01/2017 1006   PHURINE 6.0 12/01/2017 1006   GLUCOSEU 50 (A) 12/01/2017 1006   HGBUR SMALL (A) 12/01/2017 1006   BILIRUBINUR NEGATIVE 12/01/2017 1006   KETONESUR NEGATIVE 12/01/2017 1006   PROTEINUR NEGATIVE 12/01/2017 1006   NITRITE NEGATIVE 12/01/2017 1006   LEUKOCYTESUR NEGATIVE 12/01/2017 1006   Sepsis Labs Invalid input(s): PROCALCITONIN,  WBC,  LACTICIDVEN Microbiology No results found for this or any previous visit (from the past 240 hour(s)).   Time coordinating discharge: Over 30 minutes  SIGNED:   Cristy Folks, MD  [facility] 01/09/2018, 12:59 PM   If 7PM-7AM, please contact night-coverage [email or web address] Password [email or web address]

## 2018-01-09 NOTE — Progress Notes (Signed)
Patient discharge teaching given, including activity, diet, follow-up appoints, and medications. IV access was d/c'd. Vitals are stable. Skin is intact except as charted in most recent assessments. Pt to be escorted out by [facility], to be driven home.

## 2018-01-09 NOTE — Discharge Instructions (Signed)
Oxycodone extended-release capsules What is this medicine? OXYCODONE (ox i KOE done) is a pain reliever. It is used to treat constant pain that lasts for more than a few days. This medicine may be used for other purposes; ask your health care provider or pharmacist if you have questions. COMMON BRAND NAME(S): XTAMPZA What should I tell my health care provider before I take this medicine? They need to know if you have any of these conditions: -Addison's disease -brain tumor -gallbladder disease -head injury -heart disease -history of a drug or alcohol abuse problem -if you often drink alcohol -kidney disease -liver disease -lung or breathing disease, like asthma -mental illness -pancreatic disease -seizures -stomach or intestine problems -thyroid disease -an unusual or allergic reaction to oxycodone, codeine, hydrocodone, morphine, other medicines, foods, dyes, or preservatives -pregnant or trying to get pregnant -breast-feeding How should I use this medicine? Take this medicine by mouth with a full glass of water. Follow the directions on the prescription label. Take this medicine with food. You should always take it with the same amount of food each time. Take your medicine at regular intervals. Do not take it more often than directed. Do not stop taking except on your doctor's advice. A special MedGuide will be given to you by the pharmacist with each prescription and refill. Be sure to read this information carefully each time. Talk to your pediatrician regarding the use of this medicine in children. Special care may be needed. Overdosage: If you think you have taken too much of this medicine contact a poison control center or emergency room at once. NOTE: This medicine is only for you. Do not share this medicine with others. What if I miss a dose? If you miss a dose, take it as soon as you can. If it is almost time for your next dose, take only that dose. Do not take double or  extra doses. What may interact with this medicine? This medicine may interact with the following medications: -alcohol -antihistamines for allergy, cough and cold -antiviral medicines for HIV or AIDS -atropine -certain antibiotics like clarithromycin, erythromycin, linezolid, rifampin -certain medicines for anxiety or sleep -certain medicines for bladder problems like oxybutynin, tolterodine -certain medicines for depression like amitriptyline, fluoxetine, sertraline -certain medicines for fungal infections like ketoconazole, itraconazole, voriconazole -certain medicines for migraine headache like almotriptan, eletriptan, frovatriptan, naratriptan, rizatriptan, sumatriptan, zolmitriptan -certain medicines for nausea or vomiting like dolasetron, ondansetron, palonosetron -certain medicines for Parkinson's disease like benztropine, trihexyphenidyl -certain medicines for seizures like phenobarbital, phenytoin, primidone -certain medicines for stomach problems like dicyclomine, hyoscyamine -certain medicines for travel sickness like scopolamine -diuretics -general anesthetics like halothane, isoflurane, methoxyflurane, propofol -ipratropium -local anesthetics like lidocaine, pramoxine, tetracaine -MAOIs like Carbex, Eldepryl, Marplan, Nardil, and Parnate -medicines that relax muscles for surgery -methylene blue -nilotinib -other narcotic medicines for pain or cough -phenothiazines like chlorpromazine, mesoridazine, prochlorperazine, thioridazine This list may not describe all possible interactions. Give your health care provider a list of all the medicines, herbs, non-prescription drugs, or dietary supplements you use. Also tell them if you smoke, drink alcohol, or use illegal drugs. Some items may interact with your medicine. What should I watch for while using this medicine? Tell your doctor or health care professional if your pain does not go away, if it gets worse, or if you have new or  a different type of pain. You may develop tolerance to the medicine. Tolerance means that you will need a higher dose of the medication for pain relief. Tolerance  is normal and is expected if you take this medicine for a long time. Do not suddenly stop taking your medicine because you may develop a severe reaction. Your body becomes used to the medicine. This does NOT mean you are addicted. Addiction is a behavior related to getting and using a drug for a non-medical reason. If you have pain, you have a medical reason to take pain medicine. Your doctor will tell you how much medicine to take. If your doctor wants you to stop the medicine, the dose will be slowly lowered over time to avoid any side effects. There are different types of narcotic medicines (opiates). If you take more than one type at the same time or if you are taking another medicine that also causes drowsiness, you may have more side effects. Give your health care provider a list of all medicines you use. Your doctor will tell you how much medicine to take. Do not take more medicine than directed. Call emergency for help if you have problems breathing or unusual sleepiness. You may get drowsy or dizzy. Do not drive, use machinery, or do anything that needs mental alertness until you know how the medicine affects you. Do not stand or sit up quickly, especially if you are an older patient. This reduces the risk of dizzy or fainting spells. Alcohol may interfere with the effect of this medicine. Avoid alcoholic drinks. This medicine will cause constipation. Try to have a bowel movement at least every 2 to 3 days. If you do not have a bowel movement for 3 days, call your doctor or health care professional. Your mouth may get dry. Chewing sugarless gum or sucking on hard candy, and drinking plenty of water may help. Contact your doctor if the problem does not go away or is severe. What side effects may I notice from receiving this medicine? Side  effects that you should report to your doctor or health care professional as soon as possible: -allergic reactions like skin rash, itching or hives, swelling of the face, lips, or tongue -breathing problems -confusion -signs and symptoms of low blood pressure like dizziness; feeling faint or lightheaded, falls; unusually weak or tired -trouble passing urine or change in the amount of urine -trouble swallowing Side effects that usually do not require medical attention (report to your doctor or health care professional if they continue or are bothersome): -constipation -dry mouth -nausea, vomiting -tiredness This list may not describe all possible side effects. Call your doctor for medical advice about side effects. You may report side effects to FDA at 1-800-FDA-1088. Where should I keep my medicine? Keep out of the reach of children. This medicine can be abused. Keep your medicine in a safe place to protect it from theft. Do not share this medicine with anyone. Selling or giving away this medicine is dangerous and against the law. Follow the directions in the [place]. Store at room temperature between 15 and 30 degrees C (59 and 86 degrees F). Protect from light. Keep container tightly closed. This medicine may cause accidental overdose and death if it is taken by other adults, children, or pets. Flush any unused medicine down the toilet to reduce the chance of harm. Do not use the medicine after the expiration date. NOTE: This sheet is a summary. It may not cover all possible information. If you have questions about this medicine, talk to your doctor, pharmacist, or health care provider.  2018 Elsevier/Gold Standard (2016-01-03 18:07:49)

## 2018-01-09 NOTE — Progress Notes (Signed)
CM called [facility] [phone]) and made arrangements for pt  pick up for transportation to home.  Address confirmed with primary caregiver Gilmore Laroche prior. CM made bedside nurse and Tricia aware [facility] called. Whitman Hero RN,BSN,CM

## 2018-02-13 DEATH — deceased

## 2018-12-14 IMAGING — US US BIOPSY CORE LIVER
1 series · 10 of 10 positions shown · non-contrast
Comparison: CT abdomen pelvis - 11/27/2017

INDICATION: Concern for metastatic pancreatic cancer. Please from
ultrasound-guided biopsy for tissue diagnostic purposes.

EXAM:
ULTRASOUND GUIDED LIVER LESION BIOPSY

[Series 1: us biopsy core liver · 0.17mm/px · 10 acquisitions, 10 frames shown]
[im 1/10]
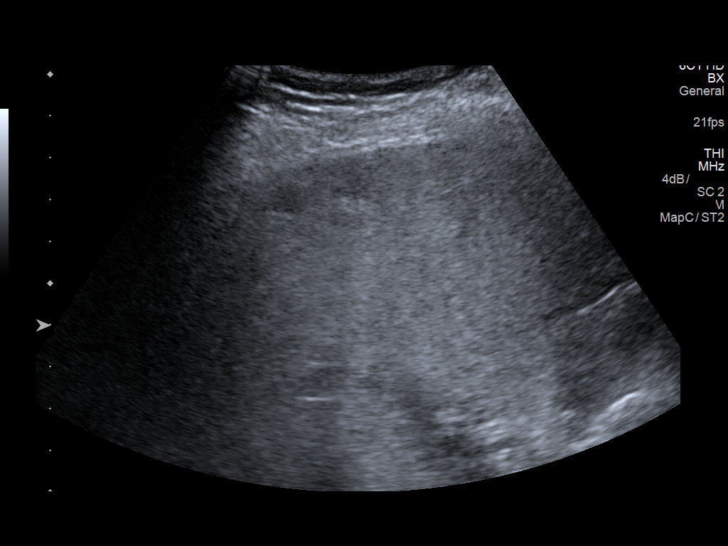
[im 2/10]
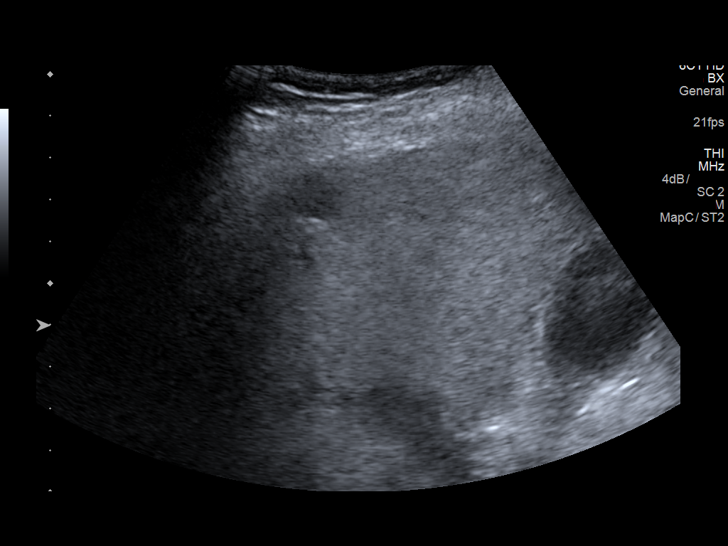
[im 3/10]
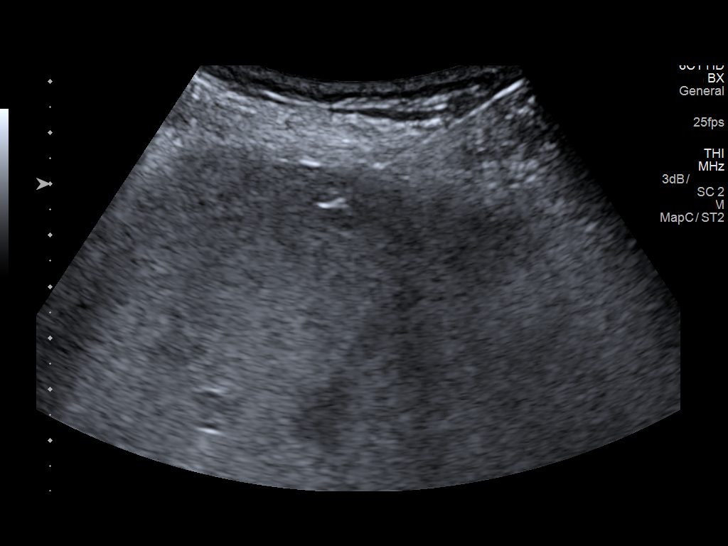
[im 4/10]
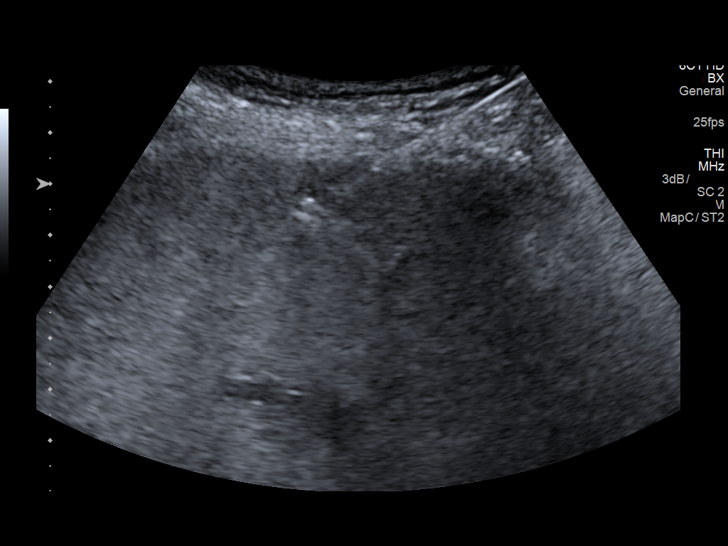
[im 5/10]
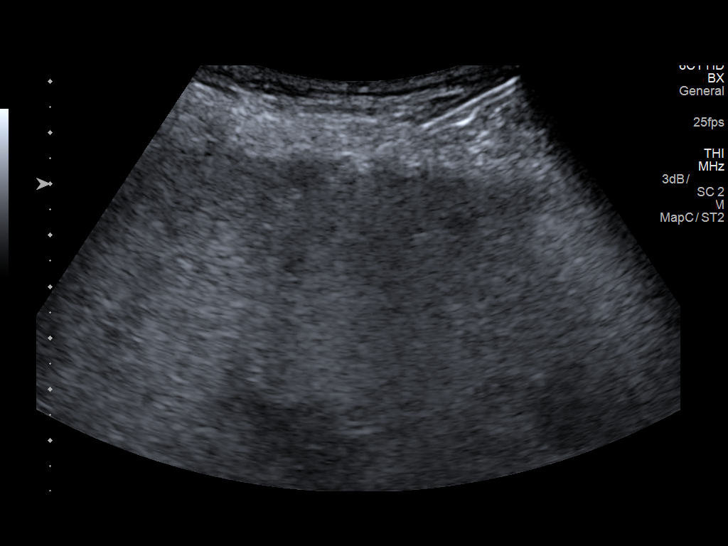
[im 6/10]
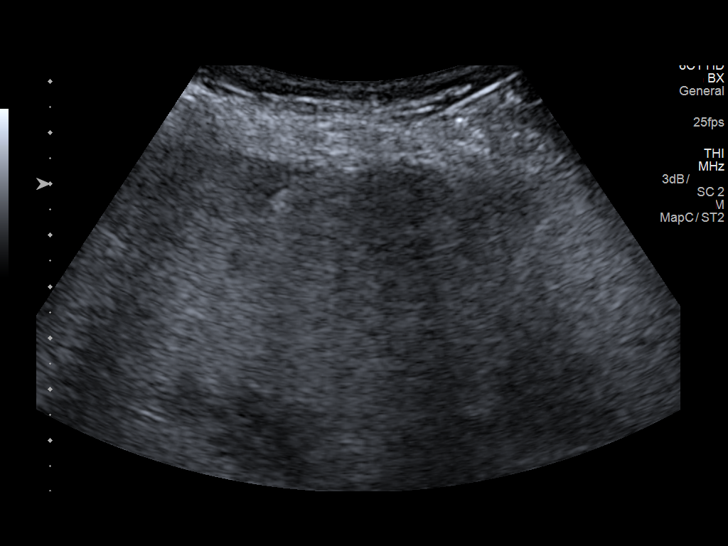
[im 7/10]
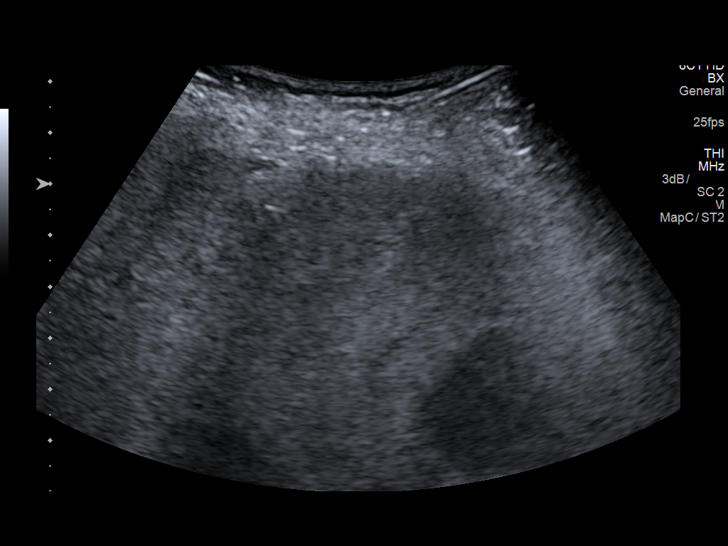
[im 8/10]
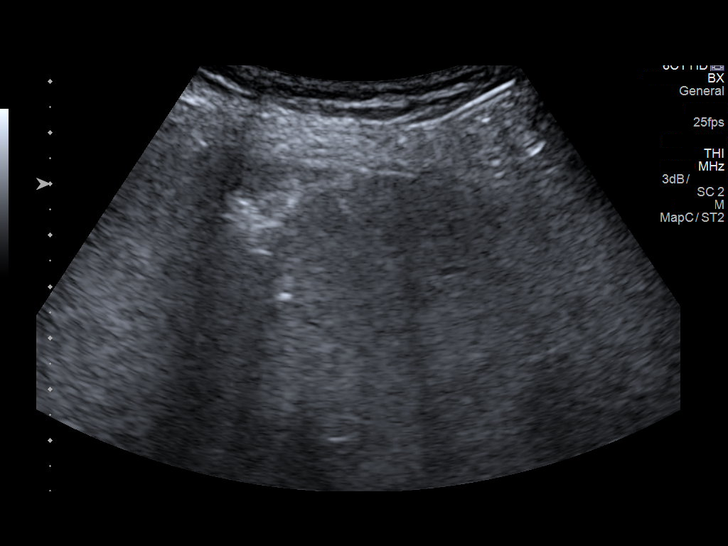
[im 9/10]
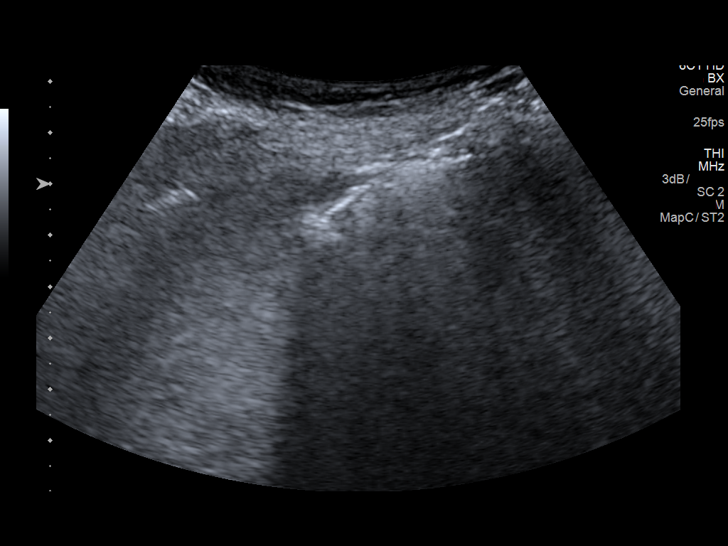
[im 10/10]
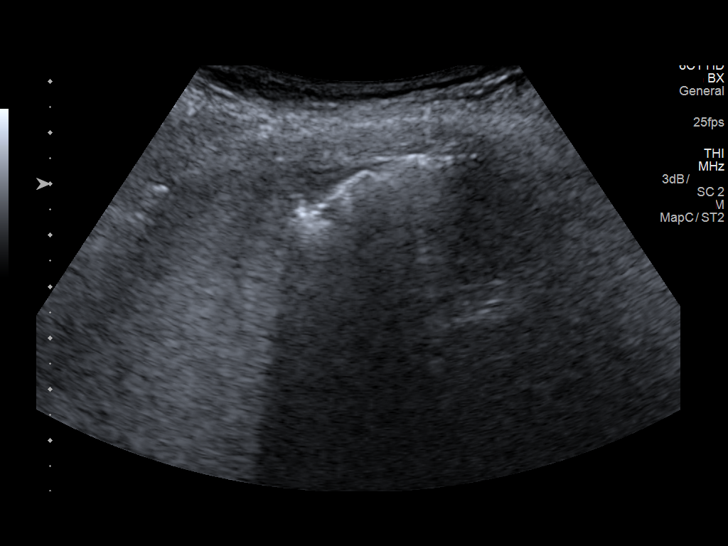

[10 of 10 positions shown; findings below may reference images not displayed]

MEDICATIONS:
None

ANESTHESIA/SEDATION:
Fentanyl 50 mcg IV; Versed 1 mg IV

Total Moderate Sedation time: 13 minutes.

The patient's level of consciousness and vital signs were monitored
continuously by radiology nursing throughout the procedure under my
direct supervision.

COMPLICATIONS:
None immediate.

PROCEDURE:
Informed written consent was obtained from the patient after a
discussion of the risks, benefits and alternatives to treatment. The
patient understands and consents the procedure. A timeout was
performed prior to the initiation of the procedure.

Ultrasound scanning was performed of the right upper abdominal
quadrant demonstrates 2 adjacent hypoechoic lesions within the
subcapsular aspect the right lobe of the liver correlating with the
ill-defined lesions seen on preceding abdominal CT image 17, series
2). Dominant ill-defined lesion measures approximately 1.3 x 1.3 cm
(image 3). This lesion was targeted for biopsy given lesion location
and sonographic window. The procedure was planned.

The right upper abdominal quadrant was prepped and draped in the
usual sterile fashion. The overlying soft tissues were anesthetized
with 1% lidocaine with epinephrine. A 17 gauge, 6.8 cm co-axial
needle was advanced into a peripheral aspect of the lesion. This was
followed by 4 core biopsies with an 18 gauge core device under
direct ultrasound guidance.

The coaxial needle tract was embolized with a small amount of
Gel-Foam slurry and superficial hemostasis was obtained with manual
compression. Post procedural scanning was negative for definitive
area of hemorrhage or additional complication. A dressing was
placed. The patient tolerated the procedure well without immediate
post procedural complication.
IMPRESSION: Technically successful ultrasound guided core needle biopsy of
indeterminate hypoechoic lesion within subcapsular aspect the right
lobe of the liver.

PLAN:
If this biopsy proves nondiagnostic, would recommend further
evaluation with abdominal MRI for further lesion characterization.

## 2019-02-25 IMAGING — CT CT CHEST W/ CM
2 of 4 series · 15 of 36 positions shown, 18 images · IV contrast (Omni 300)
Comparison: Radiograph 11/28/2017, ultrasound 12/01/2017, CT
11/27/2017

CLINICAL DATA: Pancreas and liver masses

EXAM:
CT CHEST WITH CONTRAST
TECHNIQUE: Multidetector CT imaging of the chest was performed during
intravenous contrast administration.
CONTRAST:  75mL YJZCML-4YY IOPAMIDOL (YJZCML-4YY) INJECTION 61%

[Series 3: chest with 2mm st · axial · 0.79mm/px · z∈[+1198,+1452]mm · 12 of 149 slices shown, 15 images]
[im 11/149  mediastinal]
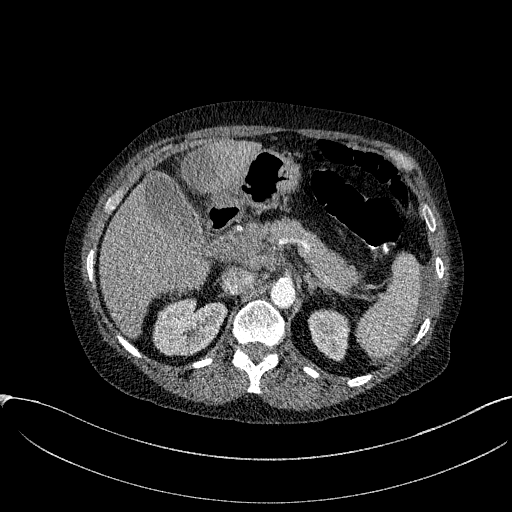
[im 11/149  lung]
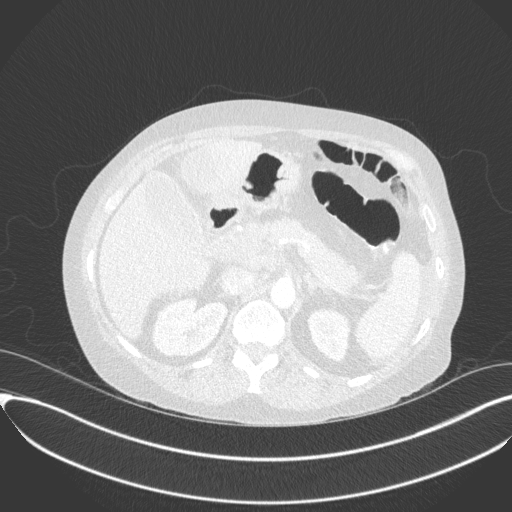
[im 22/149  lung]
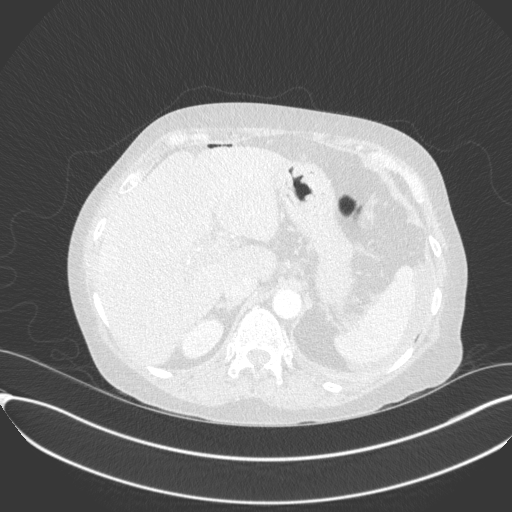
[im 32/149  lung]
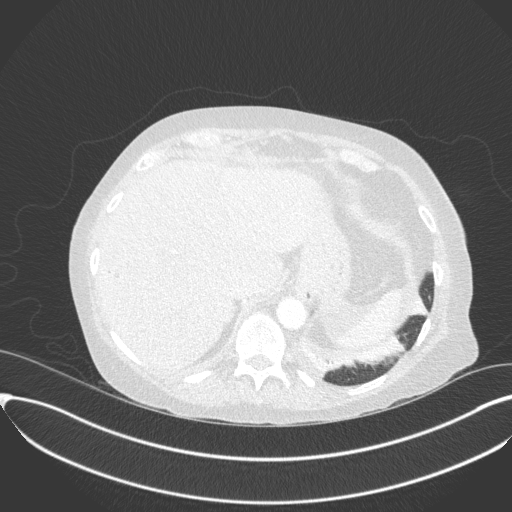
[im 43/149  lung]
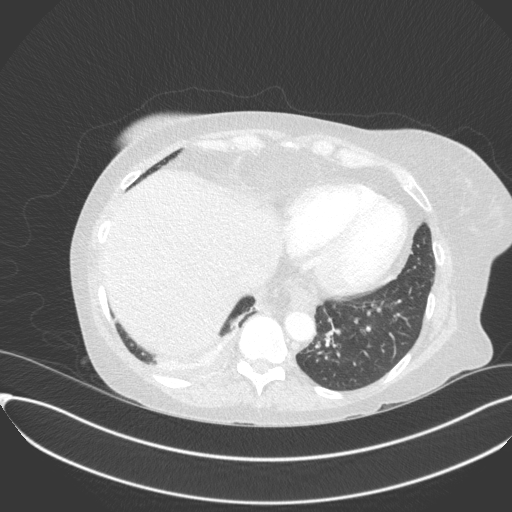
[im 53/149  mediastinal]
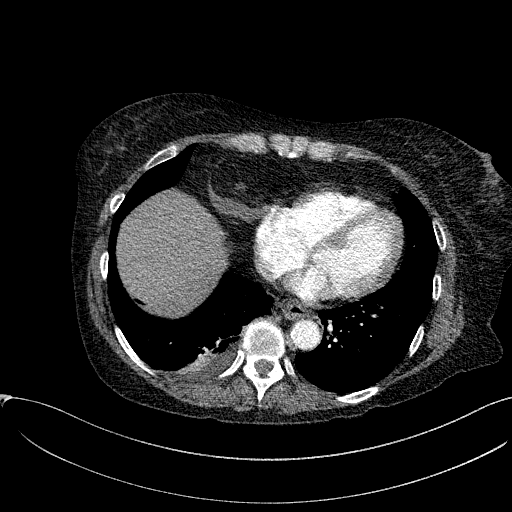
[im 53/149  lung]
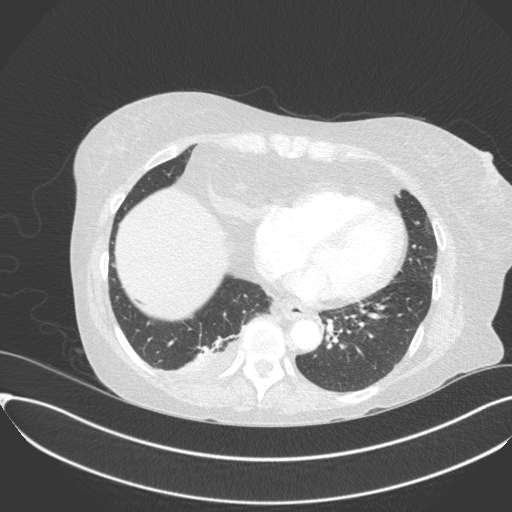
[im 64/149  lung]
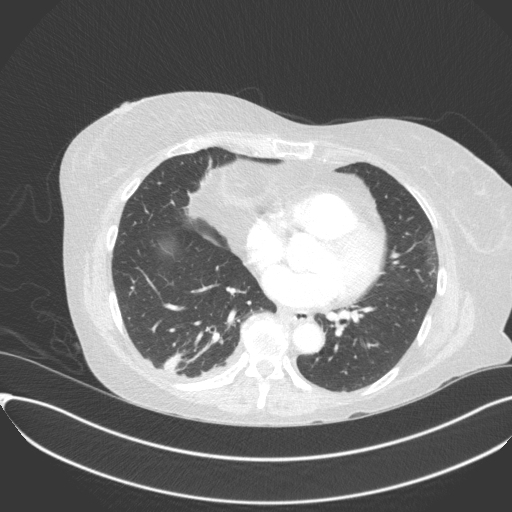
[im 85/149  lung]
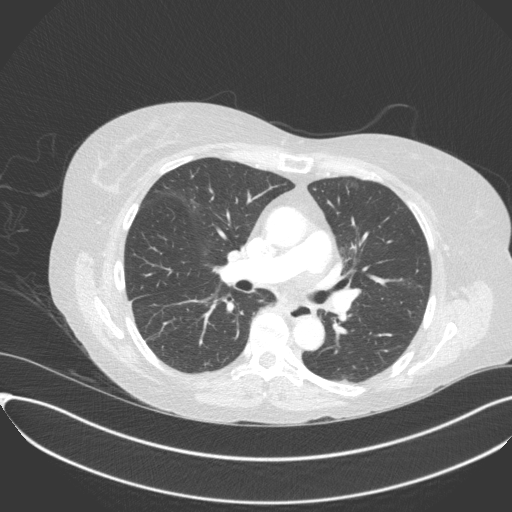
[im 96/149  lung]
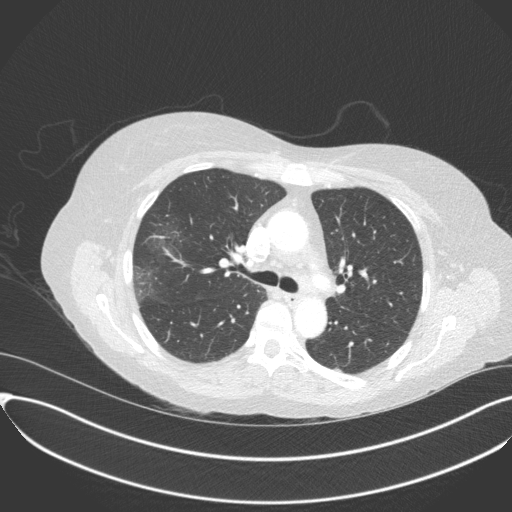
[im 106/149  mediastinal]
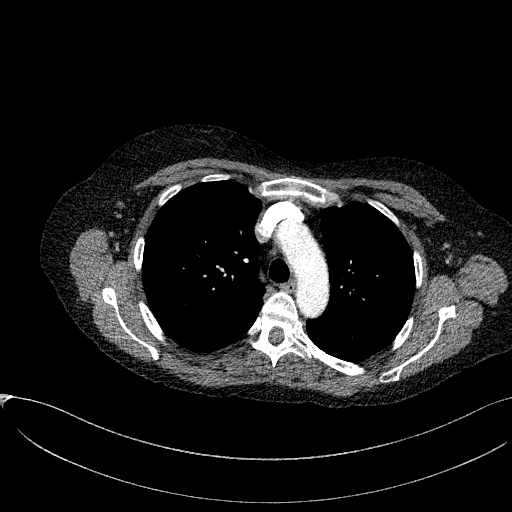
[im 106/149  lung]
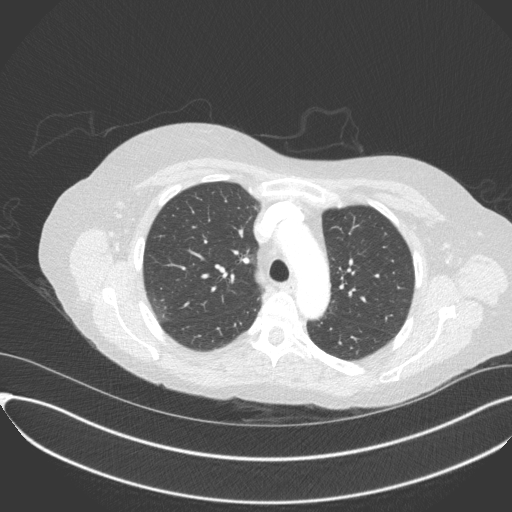
[im 117/149  lung]
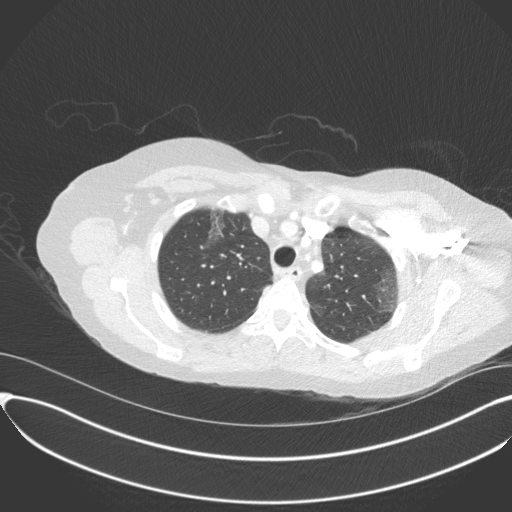
[im 127/149  lung]
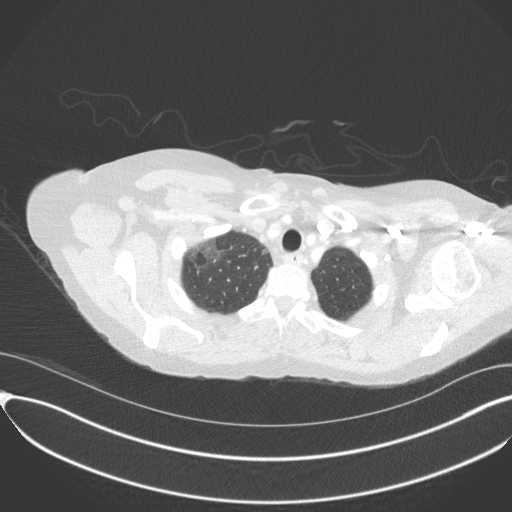
[im 138/149  lung]
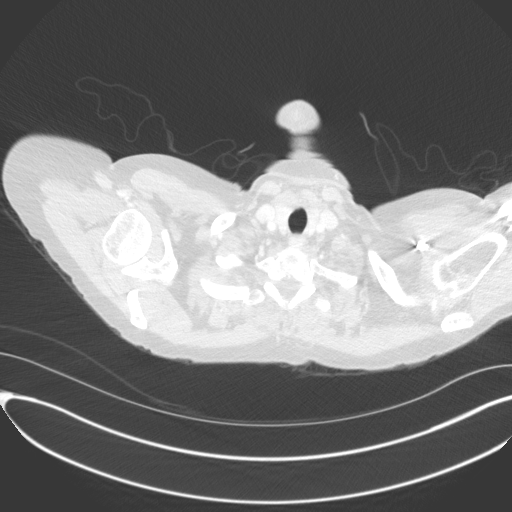

[Series 5: chest with 3mm st cor · coronal · 0.59mm/px · 3 of 101 slices shown]
[im 21/101  lung]
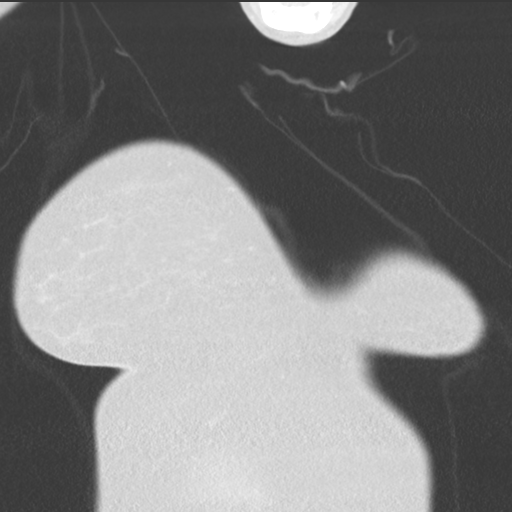
[im 41/101  lung]
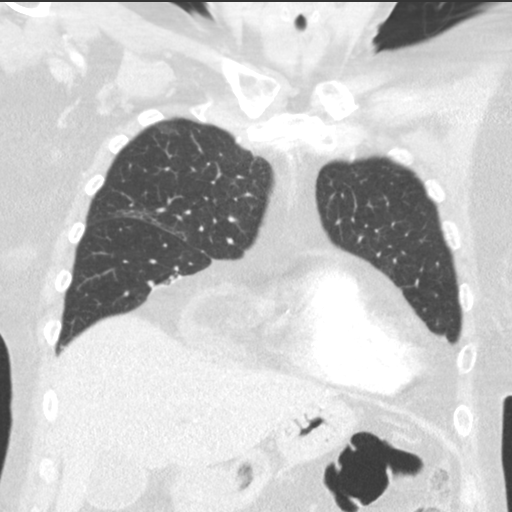
[im 61/101  lung]
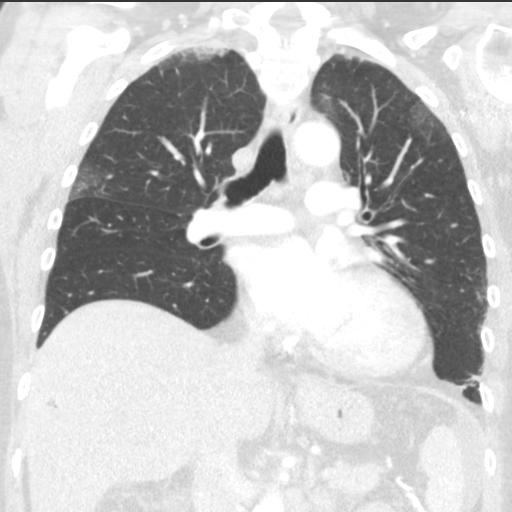

[15 of 36 positions shown; findings below may reference images not displayed]

FINDINGS: Cardiovascular: Nonaneurysmal aorta. Mild atherosclerotic
calcifications. Coronary artery calcification. Normal heart size. No
pericardial effusion. Prominent pericardial fat. Right anterior
diaphragmatic hernia containing mesentery and a small amount of
fluid in the hernia sac. This is felt to correspond to the
radiographic opacity.

Mediastinum/Nodes: Midline trachea. No thyroid mass. Borderline
lymph no measuring 9 mm in the AP window. Esophagus within normal
limits.

Lungs/Pleura: Negative for pneumothorax or pleural effusion. No
suspicious pulmonary nodules. Platelike atelectasis in the right
lower lobe with small right pleural effusion. Atelectasis at the
left lung base. Nonspecific foci of ground-glass density in the
upper lobes.

Upper Abdomen: Linear fluid and gas in the posterior right hepatic
lobe presumably related to recent liver biopsy. Tiny gas anterior to
the liver and trace perihepatic fluid. Re- demonstrated hypodense
liver lesions. Partially visualized indistinct mass at the head of
the pancreas. Small amount of fluid around the spleen.

Musculoskeletal: No chest wall abnormality. No acute or significant
osseous findings.
IMPRESSION: 1. Borderline enlarged mediastinal lymph node. Otherwise no definite
evidence for metastatic disease to the chest.
2. Nonspecific scattered foci of ground-glass density within the
bilateral upper lobes. Partial atelectasis within both lower lobes
and small amount of right pleural effusion.
3. Right anterior diaphragmatic hernia containing mesentery and
small amount of fluid in the hernia sac, felt to correspond to the
radiographic opacity
4. Small amount of perihepatic fluid and gas likely due to recent
biopsy. Linear fluid and gas containing tract in the right hepatic
lobe also likely related to recent liver biopsy. Suspicious
hypodense liver masses re- demonstrated as is a partially visualized
ill-defined mass at the head of the pancreas
5. Small amount of fluid around the spleen

Aortic Atherosclerosis ([id number]-OW6.6).
